# Patient Record
Sex: Male | Born: 1950 | Race: White | Hispanic: No | Marital: Married | Smoking: Never smoker
Health system: Southern US, Community
[De-identification: ages and names within clinical notes are randomized; demographics above are authoritative.]

## PROBLEM LIST (undated history)

## (undated) DIAGNOSIS — E119 Type 2 diabetes mellitus without complications: Secondary | ICD-10-CM

## (undated) DIAGNOSIS — J45909 Unspecified asthma, uncomplicated: Secondary | ICD-10-CM

---

## 2014-04-23 ENCOUNTER — Inpatient Hospital Stay (HOSPITAL_COMMUNITY): Payer: 59

## 2014-04-23 ENCOUNTER — Emergency Department (HOSPITAL_COMMUNITY): Payer: 59

## 2014-04-23 ENCOUNTER — Inpatient Hospital Stay (HOSPITAL_COMMUNITY): Payer: 59 | Admitting: Anesthesiology

## 2014-04-23 ENCOUNTER — Encounter (HOSPITAL_COMMUNITY): Payer: Self-pay | Admitting: *Deleted

## 2014-04-23 ENCOUNTER — Encounter (HOSPITAL_COMMUNITY): Admission: EM | Disposition: E | Payer: Self-pay | Source: Home / Self Care | Attending: Neurology

## 2014-04-23 ENCOUNTER — Inpatient Hospital Stay (HOSPITAL_COMMUNITY)
Admission: EM | Admit: 2014-04-23 | Discharge: 2014-05-18 | DRG: 023 | Disposition: E | Payer: 59 | Attending: Neurology | Admitting: Neurology

## 2014-04-23 DIAGNOSIS — E870 Hyperosmolality and hypernatremia: Secondary | ICD-10-CM | POA: Diagnosis present

## 2014-04-23 DIAGNOSIS — G8191 Hemiplegia, unspecified affecting right dominant side: Secondary | ICD-10-CM | POA: Diagnosis present

## 2014-04-23 DIAGNOSIS — Z7982 Long term (current) use of aspirin: Secondary | ICD-10-CM | POA: Diagnosis not present

## 2014-04-23 DIAGNOSIS — G936 Cerebral edema: Secondary | ICD-10-CM | POA: Diagnosis present

## 2014-04-23 DIAGNOSIS — R40243 Glasgow coma scale score 3-8: Secondary | ICD-10-CM | POA: Diagnosis present

## 2014-04-23 DIAGNOSIS — J9601 Acute respiratory failure with hypoxia: Secondary | ICD-10-CM

## 2014-04-23 DIAGNOSIS — E785 Hyperlipidemia, unspecified: Secondary | ICD-10-CM | POA: Diagnosis present

## 2014-04-23 DIAGNOSIS — J45909 Unspecified asthma, uncomplicated: Secondary | ICD-10-CM | POA: Diagnosis present

## 2014-04-23 DIAGNOSIS — Z01818 Encounter for other preprocedural examination: Secondary | ICD-10-CM

## 2014-04-23 DIAGNOSIS — R001 Bradycardia, unspecified: Secondary | ICD-10-CM | POA: Diagnosis present

## 2014-04-23 DIAGNOSIS — G935 Compression of brain: Secondary | ICD-10-CM | POA: Diagnosis present

## 2014-04-23 DIAGNOSIS — G911 Obstructive hydrocephalus: Secondary | ICD-10-CM | POA: Insufficient documentation

## 2014-04-23 DIAGNOSIS — E119 Type 2 diabetes mellitus without complications: Secondary | ICD-10-CM | POA: Diagnosis present

## 2014-04-23 DIAGNOSIS — R4020 Unspecified coma: Secondary | ICD-10-CM | POA: Diagnosis present

## 2014-04-23 DIAGNOSIS — I10 Essential (primary) hypertension: Secondary | ICD-10-CM

## 2014-04-23 DIAGNOSIS — G919 Hydrocephalus, unspecified: Secondary | ICD-10-CM | POA: Diagnosis present

## 2014-04-23 DIAGNOSIS — Z66 Do not resuscitate: Secondary | ICD-10-CM | POA: Diagnosis present

## 2014-04-23 DIAGNOSIS — R1310 Dysphagia, unspecified: Secondary | ICD-10-CM | POA: Diagnosis present

## 2014-04-23 DIAGNOSIS — I615 Nontraumatic intracerebral hemorrhage, intraventricular: Secondary | ICD-10-CM | POA: Diagnosis present

## 2014-04-23 DIAGNOSIS — I639 Cerebral infarction, unspecified: Secondary | ICD-10-CM

## 2014-04-23 DIAGNOSIS — R29810 Facial weakness: Secondary | ICD-10-CM | POA: Diagnosis present

## 2014-04-23 DIAGNOSIS — R4781 Slurred speech: Secondary | ICD-10-CM | POA: Diagnosis present

## 2014-04-23 DIAGNOSIS — J9690 Respiratory failure, unspecified, unspecified whether with hypoxia or hypercapnia: Secondary | ICD-10-CM

## 2014-04-23 DIAGNOSIS — I619 Nontraumatic intracerebral hemorrhage, unspecified: Secondary | ICD-10-CM | POA: Diagnosis present

## 2014-04-23 DIAGNOSIS — Z515 Encounter for palliative care: Secondary | ICD-10-CM | POA: Diagnosis not present

## 2014-04-23 DIAGNOSIS — J9602 Acute respiratory failure with hypercapnia: Secondary | ICD-10-CM

## 2014-04-23 HISTORY — DX: Type 2 diabetes mellitus without complications: E11.9

## 2014-04-23 HISTORY — DX: Unspecified asthma, uncomplicated: J45.909

## 2014-04-23 HISTORY — PX: CRANIECTOMY: SHX331

## 2014-04-23 LAB — URINALYSIS, ROUTINE W REFLEX MICROSCOPIC
BILIRUBIN URINE: NEGATIVE
GLUCOSE, UA: 250 mg/dL — AB
HGB URINE DIPSTICK: NEGATIVE
KETONES UR: NEGATIVE mg/dL
Leukocytes, UA: NEGATIVE
Nitrite: NEGATIVE
PH: 6 (ref 5.0–8.0)
PROTEIN: NEGATIVE mg/dL
Specific Gravity, Urine: 1.015 (ref 1.005–1.030)
Urobilinogen, UA: 1 mg/dL (ref 0.0–1.0)

## 2014-04-23 LAB — I-STAT CHEM 8, ED
BUN: 13 mg/dL (ref 6–23)
CHLORIDE: 101 mmol/L (ref 96–112)
Calcium, Ion: 1.06 mmol/L — ABNORMAL LOW (ref 1.13–1.30)
Creatinine, Ser: 0.7 mg/dL (ref 0.50–1.35)
Glucose, Bld: 205 mg/dL — ABNORMAL HIGH (ref 70–99)
HEMATOCRIT: 45 % (ref 39.0–52.0)
Hemoglobin: 15.3 g/dL (ref 13.0–17.0)
Potassium: 4.2 mmol/L (ref 3.5–5.1)
Sodium: 136 mmol/L (ref 135–145)
TCO2: 23 mmol/L (ref 0–100)

## 2014-04-23 LAB — DIFFERENTIAL
Basophils Absolute: 0 10*3/uL (ref 0.0–0.1)
Basophils Relative: 0 % (ref 0–1)
EOS PCT: 1 % (ref 0–5)
Eosinophils Absolute: 0.1 10*3/uL (ref 0.0–0.7)
LYMPHS ABS: 1.1 10*3/uL (ref 0.7–4.0)
LYMPHS PCT: 19 % (ref 12–46)
MONOS PCT: 6 % (ref 3–12)
Monocytes Absolute: 0.4 10*3/uL (ref 0.1–1.0)
Neutro Abs: 4.2 10*3/uL (ref 1.7–7.7)
Neutrophils Relative %: 74 % (ref 43–77)

## 2014-04-23 LAB — COMPREHENSIVE METABOLIC PANEL
ALT: 18 U/L (ref 0–53)
ANION GAP: 7 (ref 5–15)
AST: 26 U/L (ref 0–37)
Albumin: 3.5 g/dL (ref 3.5–5.2)
Alkaline Phosphatase: 56 U/L (ref 39–117)
BUN: 11 mg/dL (ref 6–23)
CALCIUM: 8.6 mg/dL (ref 8.4–10.5)
CO2: 25 mmol/L (ref 19–32)
Chloride: 103 mmol/L (ref 96–112)
Creatinine, Ser: 0.83 mg/dL (ref 0.50–1.35)
GFR calc non Af Amer: >90 mL/min (ref >90)
GLUCOSE: 204 mg/dL — AB (ref 70–99)
Potassium: 4 mmol/L (ref 3.5–5.1)
Sodium: 135 mmol/L (ref 135–145)
Total Bilirubin: 0.8 mg/dL (ref 0.3–1.2)
Total Protein: 6.2 g/dL (ref 6.0–8.3)

## 2014-04-23 LAB — BLOOD GAS, ARTERIAL
Acid-Base Excess: 1.3 mmol/L (ref 0.0–2.0)
BICARBONATE: 26 meq/L — AB (ref 20.0–24.0)
Drawn by: 249101
FIO2: 0.4 %
MECHVT: 680 mL
O2 Saturation: 99.1 %
PATIENT TEMPERATURE: 98.6
PCO2 ART: 46 mmHg — AB (ref 35.0–45.0)
PEEP/CPAP: 5 cmH2O
PH ART: 7.371 (ref 7.350–7.450)
PO2 ART: 142 mmHg — AB (ref 80.0–100.0)
RATE: 14 resp/min
TCO2: 27.4 mmol/L (ref 0–100)

## 2014-04-23 LAB — CBC
HCT: 40.6 % (ref 39.0–52.0)
HEMOGLOBIN: 14.2 g/dL (ref 13.0–17.0)
MCH: 31 pg (ref 26.0–34.0)
MCHC: 35 g/dL (ref 30.0–36.0)
MCV: 88.6 fL (ref 78.0–100.0)
PLATELETS: 252 10*3/uL (ref 150–400)
RBC: 4.58 MIL/uL (ref 4.22–5.81)
RDW: 12.9 % (ref 11.5–15.5)
WBC: 5.8 10*3/uL (ref 4.0–10.5)

## 2014-04-23 LAB — PROTIME-INR
INR: 1.01 (ref 0.00–1.49)
PROTHROMBIN TIME: 13.4 s (ref 11.6–15.2)

## 2014-04-23 LAB — GLUCOSE, CAPILLARY
GLUCOSE-CAPILLARY: 243 mg/dL — AB (ref 70–99)
Glucose-Capillary: 219 mg/dL — ABNORMAL HIGH (ref 70–99)
Glucose-Capillary: 240 mg/dL — ABNORMAL HIGH (ref 70–99)
Glucose-Capillary: 261 mg/dL — ABNORMAL HIGH (ref 70–99)

## 2014-04-23 LAB — RAPID URINE DRUG SCREEN, HOSP PERFORMED
AMPHETAMINES: NOT DETECTED
BARBITURATES: NOT DETECTED
BENZODIAZEPINES: NOT DETECTED
Cocaine: NOT DETECTED
Opiates: NOT DETECTED
Tetrahydrocannabinol: NOT DETECTED

## 2014-04-23 LAB — MRSA PCR SCREENING: MRSA by PCR: NEGATIVE

## 2014-04-23 LAB — SODIUM
SODIUM: 138 mmol/L (ref 135–145)
SODIUM: 139 mmol/L (ref 135–145)

## 2014-04-23 LAB — I-STAT TROPONIN, ED: TROPONIN I, POC: 0 ng/mL (ref 0.00–0.08)

## 2014-04-23 LAB — ETHANOL: Alcohol, Ethyl (B): <5 mg/dL (ref 0–9)

## 2014-04-23 LAB — APTT: aPTT: 27 seconds (ref 24–37)

## 2014-04-23 SURGERY — CRANIECTOMY POSTERIOR FOSSA DECOMPRESSION
Anesthesia: General | Site: Head | Laterality: Left

## 2014-04-23 MED ORDER — FENTANYL CITRATE 0.05 MG/ML IJ SOLN
INTRAMUSCULAR | Status: DC | PRN
  Administered 2014-04-23: 50 ug via INTRAVENOUS
  Administered 2014-04-23: 100 ug via INTRAVENOUS
  Administered 2014-04-23: 50 ug via INTRAVENOUS

## 2014-04-23 MED ORDER — PANTOPRAZOLE SODIUM 40 MG IV SOLR
40.0000 mg | Freq: Every day | INTRAVENOUS | Status: DC
  Administered 2014-04-23 – 2014-04-24 (×2): 40 mg via INTRAVENOUS
  Filled 2014-04-23 (×3): qty 40

## 2014-04-23 MED ORDER — 0.9 % SODIUM CHLORIDE (POUR BTL) OPTIME
TOPICAL | Status: DC | PRN
  Administered 2014-04-23 (×5): 1000 mL

## 2014-04-23 MED ORDER — BISACODYL 5 MG PO TBEC
5.0000 mg | DELAYED_RELEASE_TABLET | Freq: Every day | ORAL | Status: DC | PRN
  Filled 2014-04-23: qty 1

## 2014-04-23 MED ORDER — PRO-STAT SUGAR FREE PO LIQD
60.0000 mL | Freq: Every day | ORAL | Status: DC
  Administered 2014-04-23 – 2014-04-25 (×6): 60 mL
  Filled 2014-04-23 (×14): qty 60

## 2014-04-23 MED ORDER — HYDRALAZINE HCL 20 MG/ML IJ SOLN
10.0000 mg | Freq: Four times a day (QID) | INTRAMUSCULAR | Status: DC | PRN
  Administered 2014-04-23 (×2): 10 mg via INTRAVENOUS
  Administered 2014-04-24 – 2014-04-25 (×3): 20 mg via INTRAVENOUS
  Filled 2014-04-23 (×5): qty 1

## 2014-04-23 MED ORDER — BACITRACIN 50000 UNITS IM SOLR
INTRAMUSCULAR | Status: DC | PRN
  Administered 2014-04-23: 500 mL

## 2014-04-23 MED ORDER — PHENYLEPHRINE HCL 10 MG/ML IJ SOLN
INTRAMUSCULAR | Status: AC
  Filled 2014-04-23: qty 1

## 2014-04-23 MED ORDER — PANTOPRAZOLE SODIUM 40 MG IV SOLR: 40.0000 mg | Freq: Every day | INTRAVENOUS | Status: DC

## 2014-04-23 MED ORDER — WHITE PETROLATUM GEL
Status: AC
  Administered 2014-04-23: 17:00:00
  Filled 2014-04-23: qty 1

## 2014-04-23 MED ORDER — CEFAZOLIN SODIUM-DEXTROSE 2-3 GM-% IV SOLR
2.0000 g | Freq: Three times a day (TID) | INTRAVENOUS | Status: AC
  Administered 2014-04-23 – 2014-04-24 (×2): 2 g via INTRAVENOUS
  Filled 2014-04-23 (×2): qty 50

## 2014-04-23 MED ORDER — VITAL HIGH PROTEIN PO LIQD
1000.0000 mL | ORAL | Status: DC
  Administered 2014-04-23: 1000 mL
  Filled 2014-04-23 (×2): qty 1000

## 2014-04-23 MED ORDER — SODIUM CHLORIDE 0.9 % IV SOLN
INTRAVENOUS | Status: DC | PRN
  Administered 2014-04-23 (×2): via INTRAVENOUS

## 2014-04-23 MED ORDER — BISACODYL 10 MG RE SUPP: 10.0000 mg | Freq: Every day | RECTAL | Status: DC | PRN

## 2014-04-23 MED ORDER — INSULIN ASPART 100 UNIT/ML ~~LOC~~ SOLN: 0.0000 [IU] | SUBCUTANEOUS | Status: DC

## 2014-04-23 MED ORDER — PROPOFOL 10 MG/ML IV EMUL
INTRAVENOUS | Status: AC
  Filled 2014-04-23: qty 100

## 2014-04-23 MED ORDER — PROPOFOL 10 MG/ML IV BOLUS
INTRAVENOUS | Status: DC | PRN
  Administered 2014-04-23 (×2): 80 mg via INTRAVENOUS

## 2014-04-23 MED ORDER — ONDANSETRON HCL 4 MG/2ML IJ SOLN
INTRAMUSCULAR | Status: AC
  Filled 2014-04-23: qty 2

## 2014-04-23 MED ORDER — CEFAZOLIN SODIUM-DEXTROSE 2-3 GM-% IV SOLR
INTRAVENOUS | Status: AC
  Filled 2014-04-23: qty 100

## 2014-04-23 MED ORDER — LEVETIRACETAM IN NACL 500 MG/100ML IV SOLN
500.0000 mg | Freq: Two times a day (BID) | INTRAVENOUS | Status: DC
  Administered 2014-04-23 – 2014-04-24 (×3): 500 mg via INTRAVENOUS
  Filled 2014-04-23 (×5): qty 100

## 2014-04-23 MED ORDER — MICROFIBRILLAR COLL HEMOSTAT EX POWD
CUTANEOUS | Status: DC | PRN
  Administered 2014-04-23: 1 g via TOPICAL

## 2014-04-23 MED ORDER — NICARDIPINE HCL IN NACL 20-0.86 MG/200ML-% IV SOLN
3.0000 mg/h | INTRAVENOUS | Status: DC
Start: 2014-04-23 — End: 2014-04-25
  Administered 2014-04-23: 5 mg/h via INTRAVENOUS

## 2014-04-23 MED ORDER — ACETAMINOPHEN 650 MG RE SUPP
650.0000 mg | RECTAL | Status: DC | PRN
  Administered 2014-04-25: 650 mg via RECTAL
  Filled 2014-04-23: qty 1

## 2014-04-23 MED ORDER — ROCURONIUM BROMIDE 100 MG/10ML IV SOLN
INTRAVENOUS | Status: DC | PRN
  Administered 2014-04-23: 50 mg via INTRAVENOUS
  Administered 2014-04-23: 30 mg via INTRAVENOUS

## 2014-04-23 MED ORDER — DOCUSATE SODIUM 50 MG/5ML PO LIQD
100.0000 mg | Freq: Two times a day (BID) | ORAL | Status: DC | PRN
  Filled 2014-04-23: qty 10

## 2014-04-23 MED ORDER — METOCLOPRAMIDE HCL 5 MG/ML IJ SOLN
10.0000 mg | Freq: Three times a day (TID) | INTRAMUSCULAR | Status: DC
  Administered 2014-04-24 – 2014-04-25 (×5): 10 mg via INTRAVENOUS
  Filled 2014-04-23 (×8): qty 2

## 2014-04-23 MED ORDER — SODIUM CHLORIDE 3 % IV SOLN
INTRAVENOUS | Status: DC
  Administered 2014-04-23: 50 mL/h via INTRAVENOUS
  Administered 2014-04-23: 75 mL/h via INTRAVENOUS
  Administered 2014-04-24: 50 mL/h via INTRAVENOUS
  Administered 2014-04-24 (×2): 75 mL/h via INTRAVENOUS
  Filled 2014-04-23 (×10): qty 500

## 2014-04-23 MED ORDER — LIDOCAINE HCL (CARDIAC) 20 MG/ML IV SOLN
INTRAVENOUS | Status: AC
  Filled 2014-04-23: qty 5

## 2014-04-23 MED ORDER — NICARDIPINE HCL IN NACL 20-0.86 MG/200ML-% IV SOLN
INTRAVENOUS | Status: AC
  Filled 2014-04-23: qty 200

## 2014-04-23 MED ORDER — INSULIN ASPART 100 UNIT/ML ~~LOC~~ SOLN
0.0000 [IU] | SUBCUTANEOUS | Status: DC
  Administered 2014-04-23: 11 [IU] via SUBCUTANEOUS
  Administered 2014-04-23 – 2014-04-24 (×5): 7 [IU] via SUBCUTANEOUS
  Administered 2014-04-24 – 2014-04-25 (×4): 11 [IU] via SUBCUTANEOUS
  Administered 2014-04-25: 7 [IU] via SUBCUTANEOUS

## 2014-04-23 MED ORDER — FENTANYL CITRATE 0.05 MG/ML IJ SOLN
INTRAMUSCULAR | Status: AC
  Filled 2014-04-23: qty 5

## 2014-04-23 MED ORDER — SODIUM CHLORIDE 0.9 % IV SOLN
INTRAVENOUS | Status: DC
  Administered 2014-04-23: 09:00:00 via INTRAVENOUS

## 2014-04-23 MED ORDER — ACETAMINOPHEN 325 MG PO TABS: 650.0000 mg | ORAL_TABLET | ORAL | Status: DC | PRN

## 2014-04-23 MED ORDER — PHENYLEPHRINE HCL 10 MG/ML IJ SOLN
10.0000 mg | INTRAMUSCULAR | Status: DC | PRN
  Administered 2014-04-23: 10 ug/min via INTRAVENOUS

## 2014-04-23 MED ORDER — IPRATROPIUM-ALBUTEROL 0.5-2.5 (3) MG/3ML IN SOLN
3.0000 mL | Freq: Four times a day (QID) | RESPIRATORY_TRACT | Status: DC
  Administered 2014-04-23 – 2014-04-25 (×6): 3 mL via RESPIRATORY_TRACT
  Filled 2014-04-23 (×6): qty 3

## 2014-04-23 MED ORDER — CHLORHEXIDINE GLUCONATE 0.12 % MT SOLN
15.0000 mL | Freq: Two times a day (BID) | OROMUCOSAL | Status: DC
  Administered 2014-04-23 – 2014-04-25 (×4): 15 mL via OROMUCOSAL
  Filled 2014-04-23 (×4): qty 15

## 2014-04-23 MED ORDER — BUPIVACAINE-EPINEPHRINE 0.5% -1:200000 IJ SOLN
INTRAMUSCULAR | Status: DC | PRN
  Administered 2014-04-23: 10 mL

## 2014-04-23 MED ORDER — CEFAZOLIN SODIUM-DEXTROSE 2-3 GM-% IV SOLR
INTRAVENOUS | Status: DC | PRN
  Administered 2014-04-23: 2 g via INTRAVENOUS

## 2014-04-23 MED ORDER — POTASSIUM CHLORIDE IN NACL 20-0.9 MEQ/L-% IV SOLN
INTRAVENOUS | Status: DC
  Filled 2014-04-23 (×2): qty 1000

## 2014-04-23 MED ORDER — PROMETHAZINE HCL 25 MG PO TABS: 12.5000 mg | ORAL_TABLET | ORAL | Status: DC | PRN

## 2014-04-23 MED ORDER — ETOMIDATE 2 MG/ML IV SOLN
INTRAVENOUS | Status: AC
  Filled 2014-04-23: qty 20

## 2014-04-23 MED ORDER — SUCCINYLCHOLINE CHLORIDE 20 MG/ML IJ SOLN
INTRAMUSCULAR | Status: AC | PRN
  Administered 2014-04-23: 100 mg via INTRAVENOUS

## 2014-04-23 MED ORDER — BACITRACIN ZINC 500 UNIT/GM EX OINT
TOPICAL_OINTMENT | CUTANEOUS | Status: DC | PRN
  Administered 2014-04-23: 1 via TOPICAL

## 2014-04-23 MED ORDER — VITAL AF 1.2 CAL PO LIQD
1000.0000 mL | ORAL | Status: DC
  Administered 2014-04-23 – 2014-04-24 (×4): 1000 mL
  Filled 2014-04-23 (×4): qty 1000

## 2014-04-23 MED ORDER — LABETALOL HCL 5 MG/ML IV SOLN
10.0000 mg | INTRAVENOUS | Status: DC | PRN
  Administered 2014-04-24 – 2014-04-25 (×3): 20 mg via INTRAVENOUS
  Filled 2014-04-23 (×3): qty 4

## 2014-04-23 MED ORDER — FENTANYL CITRATE 0.05 MG/ML IJ SOLN: 50.0000 ug | INTRAMUSCULAR | Status: DC | PRN

## 2014-04-23 MED ORDER — ONDANSETRON HCL 4 MG/2ML IJ SOLN: 4.0000 mg | INTRAMUSCULAR | Status: DC | PRN

## 2014-04-23 MED ORDER — ACETAMINOPHEN 650 MG RE SUPP: 650.0000 mg | RECTAL | Status: DC | PRN

## 2014-04-23 MED ORDER — SENNOSIDES-DOCUSATE SODIUM 8.6-50 MG PO TABS
1.0000 | ORAL_TABLET | Freq: Two times a day (BID) | ORAL | Status: DC
Start: 2014-04-23 — End: 2014-04-25
  Administered 2014-04-23 – 2014-04-24 (×3): 1 via ORAL
  Filled 2014-04-23 (×7): qty 1

## 2014-04-23 MED ORDER — PNEUMOCOCCAL VAC POLYVALENT 25 MCG/0.5ML IJ INJ
0.5000 mL | INJECTION | INTRAMUSCULAR | Status: AC
  Administered 2014-04-24: 0.5 mL via INTRAMUSCULAR
  Filled 2014-04-23: qty 0.5

## 2014-04-23 MED ORDER — LABETALOL HCL 5 MG/ML IV SOLN
10.0000 mg | INTRAVENOUS | Status: DC | PRN
  Administered 2014-04-23: 20 mg via INTRAVENOUS
  Filled 2014-04-23: qty 4

## 2014-04-23 MED ORDER — PROPOFOL 10 MG/ML IV BOLUS
INTRAVENOUS | Status: AC
  Filled 2014-04-23: qty 20

## 2014-04-23 MED ORDER — THROMBIN 20000 UNITS EX SOLR
CUTANEOUS | Status: DC | PRN
  Administered 2014-04-23: 20 mL via TOPICAL

## 2014-04-23 MED ORDER — MORPHINE SULFATE 2 MG/ML IJ SOLN: 1.0000 mg | INTRAMUSCULAR | Status: DC | PRN

## 2014-04-23 MED ORDER — ACETAMINOPHEN 325 MG PO TABS
650.0000 mg | ORAL_TABLET | ORAL | Status: DC | PRN
  Administered 2014-04-24 – 2014-04-25 (×3): 650 mg via ORAL
  Filled 2014-04-23 (×3): qty 2

## 2014-04-23 MED ORDER — ETOMIDATE 2 MG/ML IV SOLN
INTRAVENOUS | Status: AC | PRN
  Administered 2014-04-23: 20 mg via INTRAVENOUS

## 2014-04-23 MED ORDER — PROPOFOL 10 MG/ML IV EMUL
5.0000 ug/kg/min | Freq: Once | INTRAVENOUS | Status: AC
Start: 2014-04-23 — End: 2014-04-23
  Administered 2014-04-23: 50 ug/kg/min via INTRAVENOUS

## 2014-04-23 MED ORDER — ROCURONIUM BROMIDE 50 MG/5ML IV SOLN
INTRAVENOUS | Status: AC
  Filled 2014-04-23: qty 1

## 2014-04-23 MED ORDER — ROCURONIUM BROMIDE 50 MG/5ML IV SOLN
INTRAVENOUS | Status: AC
  Filled 2014-04-23: qty 2

## 2014-04-23 MED ORDER — VITAL AF 1.2 CAL PO LIQD
1000.0000 mL | ORAL | Status: DC
  Filled 2014-04-23: qty 1000

## 2014-04-23 MED ORDER — MICROFIBRILLAR COLL HEMOSTAT EX PADS
MEDICATED_PAD | CUTANEOUS | Status: DC | PRN
Start: 2014-04-23 — End: 2014-04-23
  Administered 2014-04-23: 1 via TOPICAL

## 2014-04-23 MED ORDER — SUCCINYLCHOLINE CHLORIDE 20 MG/ML IJ SOLN
INTRAMUSCULAR | Status: AC
  Filled 2014-04-23: qty 1

## 2014-04-23 MED ORDER — CETYLPYRIDINIUM CHLORIDE 0.05 % MT LIQD
7.0000 mL | Freq: Four times a day (QID) | OROMUCOSAL | Status: DC
  Administered 2014-04-23 – 2014-04-25 (×6): 7 mL via OROMUCOSAL

## 2014-04-23 MED ORDER — STROKE: EARLY STAGES OF RECOVERY BOOK
Freq: Once | Status: DC
  Filled 2014-04-23: qty 1

## 2014-04-23 MED ORDER — ONDANSETRON HCL 4 MG PO TABS: 4.0000 mg | ORAL_TABLET | ORAL | Status: DC | PRN

## 2014-04-23 SURGICAL SUPPLY — 81 items
BAG DECANTER FOR FLEXI CONT (MISCELLANEOUS) ×3 IMPLANT
BENZOIN TINCTURE PRP APPL 2/3 (GAUZE/BANDAGES/DRESSINGS) IMPLANT
BLADE CLIPPER SURG NEURO (BLADE) ×3 IMPLANT
BLADE ULTRA TIP 2M (BLADE) IMPLANT
BRUSH SCRUB EZ 1% IODOPHOR (MISCELLANEOUS) IMPLANT
BRUSH SCRUB EZ PLAIN DRY (MISCELLANEOUS) ×3 IMPLANT
BUR ACORN 6.0 PRECISION (BURR) ×2 IMPLANT
BUR ACORN 6.0MM PRECISION (BURR) ×1
BUR TAPERED ROUTER 2.3 (BUR) ×1 IMPLANT
BURR TAPERED ROUTER 2.3 (BUR) ×3
CANISTER SUCT 3000ML PPV (MISCELLANEOUS) ×3 IMPLANT
CLIP TI MEDIUM 6 (CLIP) IMPLANT
CONT SPEC 4OZ CLIKSEAL STRL BL (MISCELLANEOUS) ×3 IMPLANT
CORDS BIPOLAR (ELECTRODE) IMPLANT
COVER MAYO STAND STRL (DRAPES) IMPLANT
DRAIN SNY WOU 7FLT (WOUND CARE) IMPLANT
DRAPE LAPAROTOMY 100X72 PEDS (DRAPES) IMPLANT
DRAPE MICROSCOPE LEICA (MISCELLANEOUS) IMPLANT
DRAPE NEUROLOGICAL W/INCISE (DRAPES) ×3 IMPLANT
DRAPE SURG 17X23 STRL (DRAPES) IMPLANT
DRAPE WARM FLUID 44X44 (DRAPE) ×3 IMPLANT
DRSG TELFA 3X8 NADH (GAUZE/BANDAGES/DRESSINGS) IMPLANT
DURAMATRIX ONLAY 2X2 (Neuro Prosthesis/Implant) ×3 IMPLANT
ELECT CAUTERY BLADE 6.4 (BLADE) IMPLANT
ELECT REM PT RETURN 9FT ADLT (ELECTROSURGICAL) ×3
ELECTRODE REM PT RTRN 9FT ADLT (ELECTROSURGICAL) ×1 IMPLANT
EVACUATOR 1/8 PVC DRAIN (DRAIN) IMPLANT
EVACUATOR SILICONE 100CC (DRAIN) IMPLANT
FORCEPS BIPOLAR SPETZLER 8 1.0 (NEUROSURGERY SUPPLIES) ×3 IMPLANT
GAUZE SPONGE 4X4 12PLY STRL (GAUZE/BANDAGES/DRESSINGS) ×3 IMPLANT
GAUZE SPONGE 4X4 16PLY XRAY LF (GAUZE/BANDAGES/DRESSINGS) IMPLANT
GLOVE BIO SURGEON STRL SZ7 (GLOVE) ×3 IMPLANT
GLOVE BIO SURGEON STRL SZ8 (GLOVE) ×3 IMPLANT
GLOVE BIO SURGEON STRL SZ8.5 (GLOVE) ×3 IMPLANT
GLOVE BIOGEL PI IND STRL 7.5 (GLOVE) ×2 IMPLANT
GLOVE BIOGEL PI INDICATOR 7.5 (GLOVE) ×4
GLOVE EXAM NITRILE LRG STRL (GLOVE) IMPLANT
GLOVE EXAM NITRILE MD LF STRL (GLOVE) IMPLANT
GLOVE EXAM NITRILE XL STR (GLOVE) IMPLANT
GLOVE EXAM NITRILE XS STR PU (GLOVE) IMPLANT
GLOVE SS BIOGEL STRL SZ 8 (GLOVE) IMPLANT
GLOVE SUPERSENSE BIOGEL SZ 8 (GLOVE)
GLOVE SURG SS PI 7.0 STRL IVOR (GLOVE) ×6 IMPLANT
GOWN STRL REUS W/ TWL LRG LVL3 (GOWN DISPOSABLE) IMPLANT
GOWN STRL REUS W/ TWL XL LVL3 (GOWN DISPOSABLE) ×3 IMPLANT
GOWN STRL REUS W/TWL LRG LVL3 (GOWN DISPOSABLE)
GOWN STRL REUS W/TWL XL LVL3 (GOWN DISPOSABLE) ×6
KIT BASIN OR (CUSTOM PROCEDURE TRAY) ×3 IMPLANT
KIT ROOM TURNOVER OR (KITS) ×3 IMPLANT
NEEDLE HYPO 22GX1.5 SAFETY (NEEDLE) ×3 IMPLANT
NS IRRIG 1000ML POUR BTL (IV SOLUTION) ×15 IMPLANT
PACK CRANIOTOMY (CUSTOM PROCEDURE TRAY) ×3 IMPLANT
PAD ARMBOARD 7.5X6 YLW CONV (MISCELLANEOUS) ×12 IMPLANT
PATTIES SURGICAL 1/4 X 3 (GAUZE/BANDAGES/DRESSINGS) IMPLANT
PATTIES SURGICAL 1X1 (DISPOSABLE) ×3 IMPLANT
PLATE 1.5  2HOLE MED NEURO (Plate) ×6 IMPLANT
PLATE 1.5 2HOLE MED NEURO (Plate) ×3 IMPLANT
RUBBERBAND STERILE (MISCELLANEOUS) IMPLANT
SCREW SELF DRILL HT 1.5/4MM (Screw) ×18 IMPLANT
SPONGE LAP 4X18 X RAY DECT (DISPOSABLE) IMPLANT
SPONGE NEURO XRAY DETECT 1X3 (DISPOSABLE) ×9 IMPLANT
STAPLER SKIN PROX WIDE 3.9 (STAPLE) ×3 IMPLANT
SUT ETHILON 2 0 FS 18 (SUTURE) IMPLANT
SUT ETHILON 3 0 FSL (SUTURE) IMPLANT
SUT NURALON 4 0 TR CR/8 (SUTURE) ×3 IMPLANT
SUT PROLENE 6 0 BV (SUTURE) IMPLANT
SUT VIC AB 1 CT1 18XBRD ANBCTR (SUTURE) ×1 IMPLANT
SUT VIC AB 1 CT1 8-18 (SUTURE) ×2
SUT VIC AB 2-0 CP2 18 (SUTURE) ×6 IMPLANT
SUT VIC AB 3-0 SH 8-18 (SUTURE) ×3 IMPLANT
SYR 20ML ECCENTRIC (SYRINGE) ×3 IMPLANT
SYR BULB 3OZ (MISCELLANEOUS) ×6 IMPLANT
SYR CONTROL 10ML LL (SYRINGE) IMPLANT
TAPE CLOTH SURG 4X10 WHT LF (GAUZE/BANDAGES/DRESSINGS) ×3 IMPLANT
TOWEL OR 17X24 6PK STRL BLUE (TOWEL DISPOSABLE) ×3 IMPLANT
TOWEL OR 17X26 10 PK STRL BLUE (TOWEL DISPOSABLE) ×3 IMPLANT
TRAY FOLEY CATH 14FRSI W/METER (CATHETERS) IMPLANT
TUBE CONNECTING 12'X1/4 (SUCTIONS) ×1
TUBE CONNECTING 12X1/4 (SUCTIONS) ×2 IMPLANT
UNDERPAD 30X30 INCONTINENT (UNDERPADS AND DIAPERS) IMPLANT
WATER STERILE IRR 1000ML POUR (IV SOLUTION) ×3 IMPLANT

## 2014-04-23 NOTE — Progress Notes (Signed)
Transported pt on ventilator from ED trauma C to 76M 10. Increased VT to 730 prior to transport to match 8 cc/kg order. Bedside report given to RT.

## 2014-04-23 NOTE — Consult Note (Signed)
PULMONARY / CRITICAL CARE MEDICINE   Name: Clinton [name] MRN: [id number] DOB: [birth year]    ADMISSION DATE:  05/07/2014  CONSULTATION DATE:  04/21/2014  REFERRING MD :  Cyril Mourning (Stroke)  CHIEF COMPLAINT:  ICH, vent management   INITIAL PRESENTATION:  64yo male with hx DM and ?asthma admitted 3/7 with acute large left ICH with midline shift.  Intubated in ER and PCCM consulted for vent management.   STUDIES:  CT head 3/7>>> large L temporal and occipital ICH 8.3 cm with 11mm of L->R shift   SIGNIFICANT EVENTS:    HISTORY OF PRESENT ILLNESS:  64 yo male with hx DM, asthma, other PMH unknown who was found by a passerby in his car on the side of the road with confusion, R sided weakness and slurred speech.  Last seen normal at 715am leaving for work, found in car at 730am.  Initial BP 180/60.  He became increasingly hypoxic with worsening neuro status and was intubated in ER.  PCCM consulted for vent management and medical assist.   PAST MEDICAL HISTORY :   has a past medical history of Diabetes mellitus without complication and Asthma.  has no past surgical history on file. Prior to Admission medications   Not on File   Allergies not on file  FAMILY HISTORY:  has no family status information on file.  SOCIAL HISTORY:    REVIEW OF SYSTEMS:  Unable.  As per HPI obtained from records.   SUBJECTIVE:   VITAL SIGNS: Temp:  [97 F (36.1 C)-99.1 F (37.3 C)] 97 F (36.1 C) (03/07 0911) Pulse Rate:  [59-74] 67 (03/07 0900) Resp:  [15-21] 17 (03/07 0900) BP: (123-200)/(59-84) 123/59 mmHg (03/07 0900) SpO2:  [93 %-99 %] 98 % (03/07 0900) Weight:  [263 lb (119.296 kg)] 263 lb (119.296 kg) (03/07 0835) HEMODYNAMICS:   VENTILATOR SETTINGS:   INTAKE / OUTPUT: No intake or output data in the 24 hours ending 04/17/2014 0933  PHYSICAL EXAMINATION: General:  wdwn male, agitated  Neuro:  Sedated on propofol, rass +1, moving L arm, tries to pull at ETT HEENT:  Mm moist, no JVD, ETT   Cardiovascular:  s1s2 rrr Lungs:  resps even, non labored, mildly tachypneic on vent, very diminished throughout, few exp wheeze  Abdomen:  Soft, +bs  Musculoskeletal:  Warm and dry, no edema   LABS:  CBC  Recent Labs Lab 04/22/2014 0822 04/21/2014 0840  WBC 5.8  --   HGB 14.2 15.3  HCT 40.6 45.0  PLT 252  --    Coag's  Recent Labs Lab 05/17/2014 0822  APTT 27  INR 1.01   BMET  Recent Labs Lab 05/11/2014 0822 05/11/2014 0840  NA 135 136  K 4.0 4.2  CL 103 101  CO2 25  --   BUN 11 13  CREATININE 0.83 0.70  GLUCOSE 204* 205*   Electrolytes  Recent Labs Lab 04/24/2014 0822  CALCIUM 8.6   Sepsis Markers No results for input(s): LATICACIDVEN, PROCALCITON, O2SATVEN in the last 168 hours. ABG No results for input(s): PHART, PCO2ART, PO2ART in the last 168 hours. Liver Enzymes  Recent Labs Lab 04/18/2014 0822  AST 26  ALT 18  ALKPHOS 56  BILITOT 0.8  ALBUMIN 3.5   Cardiac Enzymes No results for input(s): TROPONINI, PROBNP in the last 168 hours. Glucose No results for input(s): GLUCAP in the last 168 hours.  Imaging No results found.   ASSESSMENT / PLAN:   NEUROLOGIC Large L ICH with midline shift  P:  RASS goal: -1 Propofol gtt for sedation  F/u CT scan  3% saline per neuro    PULMONARY OETT 3/7>>> Acute respiratory failure - in setting large ICH ?hx asthma  P:   Vent support - 8cc/kg  F/u CXR  F/u ABG now  BD's - will schedule for now    CARDIOVASCULAR CVL L IJ CVL 3/7>>> HTN  P:  Continue cardene gtt for BP control - goal SBP 140s  PRN labetalol  Place CVL for 3% saline   RENAL No active issue  P:   Monitor chem with 3% NaCl   GASTROINTESTINAL No active issue  P:   Early TF  PPI   HEMATOLOGIC ICH P:  Trend CBC SCD's   INFECTIOUS No active issue  P:   Monitor fever, wbc curve off abx   ENDOCRINE DM  P:   SSI    FAMILY  - Updates:  No family available 3/7  - Inter-disciplinary family meet or Palliative  Care meeting due by:  3/14  Dirk DressKaty Whiteheart, NP 05/10/2014  9:33 AM Pager: [phone] or [phone]  My neurologic exam now is very poor, patient is posturing, babenski is up going, no pupillary or gag reflexes noted.  Wife has not spoken with anybody and had a lot of questions.  I am concerned that patient herniating.  Contacted neurology to see if neurosurgery needs to evaluate patient.  In the meantime, continue full vent support with goal CO2 of 35.  Continue three percent saline.  Asked neurology to come back and re-examine patient given deterioration.  The patient is critically ill with multiple organ systems failure and requires high complexity decision making for assessment and support, frequent evaluation and titration of therapies, application of advanced monitoring technologies and extensive interpretation of multiple databases.   Critical Care Time devoted to patient care services described in this note is  35  Minutes. This time reflects time of care of this signee Dr Koren BoundWesam Yacoub. This critical care time does not reflect procedure time, or teaching time or supervisory time of PA/NP/Med student/Med Resident etc but could involve care discussion time.  Alyson ReedyWesam G. Yacoub, M.D. [facility]monary/Critical Care Medicine. Pager: [phone]. After hours pager: [phone].

## 2014-04-23 NOTE — Progress Notes (Signed)
eLink Physician-Brief Progress Note Patient Name: William HamburgerMichael Derderian DOB: 12-03-1950 MRN: [id number]   Date of Service  05/07/2014  HPI/Events of Note  Hypertensive s/p crainiotomy. Goal SBP 140-160. Note also on hypertonic saline, would like to avoid the fluid load that would come with nicardipine gtt. Has been bradycardic so labetalol not a good choice. Will try hydralazine first and adjust depending on results  eICU Interventions       Intervention Category Intermediate Interventions: Hypertension - evaluation and management  Kaytee Taliercio S. 05/13/2014, 5:17 PM

## 2014-04-23 NOTE — Consult Note (Addendum)
Referring Physician: ED    Chief Complaint: code stroke, confusion, dysarthria, right hemiparesis, right face weakness  HPI:                                                                                                                                         Clinton [name] is an 64 y.o. male with a past medical history significant for DM and asthma, brought in as a code stroke via EMS due to acute onset of the above stated symptoms.  As per EMS, patient was heading to work when a passerby saw him waving from inside his vehicle, in the driver seat. He was driving the wrong way. EMS was called and found him confused, with right sided weakness, slurred speech, a left gaze preference and BP 182/80.  Upon arrival to the ED patient with NIHSS 24. CT brain was personally reviewed and revealed a large left temporal-occipital ICH measuring 8.3 cm with surrounding vasogenic edema and11 mm of left to right midline shift. No intraventricular extension or hydrocephalus. INR 1.1, PTT 27, platelets 252. Takes a baby aspirin daily but no report of recent fall, head trauma, or use or anticoagulants. Patient was not protecting his airway and required to be intubated. Started on nicardipine infusion. Date last known well: 04/18/2014  Time last known well: 7:15 am tPA Given: no, ICH NIHSS: 24   Past Medical History  Diagnosis Date  . Diabetes mellitus without complication   . Asthma     History reviewed. No pertinent past surgical history.  History reviewed. No pertinent family history. Social History:  has no tobacco, alcohol, and drug history on file.  Allergies: Allergies not on file  Medications:                                                                                                                           Scheduled: .  stroke: mapping our early stages of recovery book   Does not apply Once  . etomidate      . feeding supplement (VITAL HIGH PROTEIN)  1,000 mL Per Tube Q24H  . insulin  aspart  0-15 Units Subcutaneous 6 times per day  . lidocaine (cardiac) 100 mg/33m      . niCARdipine in saline      . pantoprazole (PROTONIX) IV  40 mg Intravenous QHS  . propofol      .  rocuronium      . senna-docusate  1 tablet Oral BID  . succinylcholine        ROS: unable to obtain due to mental status                                                                                                                                      History obtained from chart review  Physical exam: no apparent distress. Blood pressure 123/59, pulse 67, temperature 97 F (36.1 C), temperature source Rectal, resp. rate 17, height _0  (1.981 m), weight 119.296 kg (263 lb), SpO2 98 %. Head: normocephalic. Neck: supple, no bruits, no JVD. Cardiac: no murmurs. Lungs: clear. Abdomen: soft, no tender, no mass. Extremities: no edema. Skin: no rash  Neurologic Examination:                                                                                                      General: Mental Status: Awake,  Dysarthric, follows simple commands inconsistently,  Cranial Nerves: II: Discs were not assessed; Visual fields appear to be impaired in the right, pupils equal, round, reactive to light III,IV, VI: ptosis not present, left gaze preference V,VII: smile asymmetric due to right face weakness, facial light touch sensation unreliable VIII: hearing normal bilaterally IX,X: unable to assess XI: bilateral shoulder shrug no tested XII: midline tongue Motor: Dense right hemiplegia. Tone diminished in the right  Sensory: Pinprick and light touch decreased in the right side Deep Tendon Reflexes:  Right: Upper Extremity   Left: Upper extremity   biceps (C-5 to C-6) 2/4   biceps (C-5 to C-6) 2/4 tricep (C7) 2/4    triceps (C7) 2/4 Brachioradialis (C6) 2/4  Brachioradialis (C6) 2/4  Lower Extremity Lower Extremity  quadriceps (L-2 to L-4) 2/4   quadriceps (L-2 to L-4) 2/4 Achilles (S1) 2/4   Achilles  (S1) 2/4  Plantars: Right: upgoing   Left: downgoing Cerebellar: Unable to test at this time. Gait:  Unable to test at this moment.    Results for orders placed or performed during the hospital encounter of 04/27/2014 (from the past 48 hour(s))  Ethanol     Status: None   Collection Time: 04/28/2014  8:22 AM  Result Value Ref Range   Alcohol, Ethyl (B) <5 0 - 9 mg/dL    Comment:        LOWEST DETECTABLE LIMIT FOR SERUM ALCOHOL IS 11 mg/dL FOR MEDICAL PURPOSES ONLY   Protime-INR     Status: None  Collection Time: 05/03/2014  8:22 AM  Result Value Ref Range   Prothrombin Time 13.4 11.6 - 15.2 seconds   INR 1.01 0.00 - 1.49  APTT     Status: None   Collection Time: 04/29/2014  8:22 AM  Result Value Ref Range   aPTT 27 24 - 37 seconds  CBC     Status: None   Collection Time: 04/24/2014  8:22 AM  Result Value Ref Range   WBC 5.8 4.0 - 10.5 K/uL   RBC 4.58 4.22 - 5.81 MIL/uL   Hemoglobin 14.2 13.0 - 17.0 g/dL   HCT 40.6 39.0 - 52.0 %   MCV 88.6 78.0 - 100.0 fL   MCH 31.0 26.0 - 34.0 pg   MCHC 35.0 30.0 - 36.0 g/dL   RDW 12.9 11.5 - 15.5 %   Platelets 252 150 - 400 K/uL  Differential     Status: None   Collection Time: 05/16/2014  8:22 AM  Result Value Ref Range   Neutrophils Relative % 74 43 - 77 %   Neutro Abs 4.2 1.7 - 7.7 K/uL   Lymphocytes Relative 19 12 - 46 %   Lymphs Abs 1.1 0.7 - 4.0 K/uL   Monocytes Relative 6 3 - 12 %   Monocytes Absolute 0.4 0.1 - 1.0 K/uL   Eosinophils Relative 1 0 - 5 %   Eosinophils Absolute 0.1 0.0 - 0.7 K/uL   Basophils Relative 0 0 - 1 %   Basophils Absolute 0.0 0.0 - 0.1 K/uL  Comprehensive metabolic panel     Status: Abnormal   Collection Time: 05/07/2014  8:22 AM  Result Value Ref Range   Sodium 135 135 - 145 mmol/L   Potassium 4.0 3.5 - 5.1 mmol/L   Chloride 103 96 - 112 mmol/L   CO2 25 19 - 32 mmol/L   Glucose, Bld 204 (H) 70 - 99 mg/dL   BUN 11 6 - 23 mg/dL   Creatinine, Ser 0.83 0.50 - 1.35 mg/dL   Calcium 8.6 8.4 - 10.5 mg/dL    Total Protein 6.2 6.0 - 8.3 g/dL   Albumin 3.5 3.5 - 5.2 g/dL   AST 26 0 - 37 U/L   ALT 18 0 - 53 U/L   Alkaline Phosphatase 56 39 - 117 U/L   Total Bilirubin 0.8 0.3 - 1.2 mg/dL   GFR calc non Af Amer >90 >90 mL/min   GFR calc Af Amer >90 >90 mL/min    Comment: (NOTE) The eGFR has been calculated using the CKD EPI equation. This calculation has not been validated in all clinical situations. eGFR's persistently <90 mL/min signify possible Chronic Kidney Disease.    Anion gap 7 5 - 15  I-Stat Troponin, ED (not at [facility])     Status: None   Collection Time: 04/22/2014  8:33 AM  Result Value Ref Range   Troponin i, poc 0.00 0.00 - 0.08 ng/mL   Comment 3            Comment: Due to the release kinetics of cTnI, a negative result within the first hours of the onset of symptoms does not rule out myocardial infarction with certainty. If myocardial infarction is still suspected, repeat the test at appropriate intervals.   I-Stat Chem 8, ED     Status: Abnormal   Collection Time: 04/24/2014  8:40 AM  Result Value Ref Range   Sodium 136 135 - 145 mmol/L   Potassium 4.2 3.5 - 5.1 mmol/L   Chloride  101 96 - 112 mmol/L   BUN 13 6 - 23 mg/dL   Creatinine, Ser 0.70 0.50 - 1.35 mg/dL   Glucose, Bld 205 (H) 70 - 99 mg/dL   Calcium, Ion 1.06 (L) 1.13 - 1.30 mmol/L   TCO2 23 0 - 100 mmol/L   Hemoglobin 15.3 13.0 - 17.0 g/dL   HCT 45.0 39.0 - 52.0 %   Ct Head Wo Contrast  04/24/2014   CLINICAL DATA:  Right-sided weakness, slurred speech.  EXAM: CT HEAD WITHOUT CONTRAST  TECHNIQUE: Contiguous axial images were obtained from the base of the skull through the vertex without intravenous contrast.  COMPARISON:  None.  FINDINGS: There is a large left posterior temporal and occipital hemorrhage measuring 8.3 x 2.6 cm. Surrounding edema. Mass effect on the adjacent lateral ventricle. There is midline shift with transtentorial herniation of approximately 11 mm from left-to-right. No hydrocephalus. No  intraventricular blood visualized at this time.  No acute calvarial abnormality. Visualized paranasal sinuses and mastoids clear. Orbital soft tissues unremarkable.  IMPRESSION: Large left temporal and occipital intra cerebral hemorrhage measuring up to 8.3 cm with 11 mm of left-to-right midline shift.  Critical Value/emergent results were called by telephone at the time of interpretation on 05/17/2014 at 8:39 am to Dr. Aram Beecham, who verbally acknowledged these results.   Electronically Signed   By: Rolm Baptise M.D.   On: 05/07/2014 08:39   Dg Chest Port 1 View  05/09/2014   CLINICAL DATA:  Status post intubation, NG tube insertion  EXAM: PORTABLE CHEST - 1 VIEW  COMPARISON:  None.  FINDINGS: Endotracheal to 7.4 cm from carinal. NG tube extends below the margin of the film and in the course of the esophagus. No pneumothorax. No pulmonary edema.  IMPRESSION: Support apparatus in good position.   Electronically Signed   By: Suzy Bouchard M.D.   On: 04/28/2014 09:15    Assessment: 64 y.o. male with large lobar left temporal and occipital ICH. No IV extension or hydrocephalus but significant left to right MLF. Takes a baby aspirin daily but no evidence of coagulopathy, recent head trauma, or use of anticoagulants. Patient hypertensive in the ED requiring IV nicardipine, thus although a lobar hemorrhage a primary hypertensive ICH still likely. Admit to NICU. Start 3% hypertonic saline. Continue nicardipine infusion, target SBP 140. CT brain in am (further neuro-imaging as per stroke attending). Stroke team will follow up tomorrow.  Stroke Risk Factors - DM  Dorian Pod, MD [facility] [phone]  05/06/2014, 9:39 AM   Addendum: called by ICU attending regarding patient decline in neurological status. He is posturing. I spoke with neurosurgery who will operate on patient now.  Dorian Pod, MD

## 2014-04-23 NOTE — Procedures (Signed)
Central Venous Catheter Insertion Procedure Note William HamburgerMichael Dunivan [id number] 1950-04-13  Procedure: Insertion of Central Venous Catheter Indications: Assessment of intravascular volume, Drug and/or fluid administration and 3% saline   Procedure Details Consent: Unable to obtain consent because of emergent medical necessity. Time Out: Verified patient identification, verified procedure, site/side was marked, verified correct patient position, special equipment/implants available, medications/allergies/relevent history reviewed, required imaging and test results available.  Performed  Maximum sterile technique was used including antiseptics, cap, gloves, gown, hand hygiene, mask and sheet. Skin prep: Chlorhexidine; local anesthetic administered A antimicrobial bonded/coated triple lumen catheter was placed in the left internal jugular vein using the Seldinger technique.  Evaluation Blood flow good Complications: No apparent complications Patient did tolerate procedure well. Chest X-ray ordered to verify placement.  CXR: pending.  Performed under direct MD supervision.  Performed using ultrasound guidance.  Wire visualized in vessel under ultrasound.   Dirk DressKaty Whiteheart, NP 05/09/2014  10:25 AM  U/S used in placement.  I was present and supervised the entire procedure.  Alyson ReedyWesam G. Glenmore Karl, M.D. [facility]/Critical Care Medicine. Pager: [phone]. After hours pager: [phone].

## 2014-04-23 NOTE — Procedures (Signed)
Intubation Procedure Note William HamburgerMichael Palau [id number] 22-Aug-1950  Procedure: Intubation Indications: Airway protection and maintenance  Procedure Details Consent: Unable to obtain consent because of altered level of consciousness. Time Out: Verified patient identification, verified procedure, site/side was marked, verified correct patient position, special equipment/implants available, medications/allergies/relevent history reviewed, required imaging and test results available.  Performed  Maximum sterile technique was used including gloves, gown, hand hygiene and mask.  MAC and 4    Evaluation Hemodynamic Status: BP stable throughout; O2 sats: stable throughout Patient's Current Condition: stable Complications: No apparent complications Patient did tolerate procedure well. Chest X-ray ordered to verify placement.  CXR: pending.  Pt intubated for airway protection following Code Stroke, using Mac 4 with 8.0 ett on 1st attempt. Bilateral breath sounds, diminished throughout. Positive color change, CXR pending at this time. Copious oral secretions (clear/ pink tinged). Pt stable throughout with no complications.    Carolan ShiverKelley, Cyara Devoto M 05/05/2014

## 2014-04-23 NOTE — ED Notes (Signed)
Pt last seen normal at 0715.  Pt driving to work; on looker noticed pt pull to side of road and "acting erratic."  EMS called and responded at 0730.  EMS stated right sided paralysis, confusion, slurred speech.  180/60 intial pressure.  Showed up to ER at 0815 160/80, urinary incontinence.

## 2014-04-23 NOTE — Progress Notes (Signed)
INITIAL NUTRITION ASSESSMENT  DOCUMENTATION CODES Per approved criteria  -Obesity Unspecified   INTERVENTION:  D/C Vital High Protein  Initiate Vital AF 1.2 @ 40 ml/hr via OG tube   60 ml Prostat five times per day.    Tube feeding regimen provides 2152 kcal, 222 grams of protein, and 778 ml of H2O.   NUTRITION DIAGNOSIS: Inadequate oral intake related to inability to eat as evidenced by NPO status  Goal: Enteral nutrition to provide 60-70% of estimated calorie needs (22-25 kcals/kg ideal body weight) and 100% of estimated protein needs, based on ASPEN guidelines for hypocaloric, high protein feeding in critically ill obese individuals   Monitor:  Vent status, TF adequacy and tolerance, plan of care  Reason for Assessment: Consult received to initiate and manage enteral nutrition support.  64 y.o. male   ASSESSMENT: Pt admitted with large left ICH now s/p craniotomy for evacuation. Pt not following commands, posturing.  Per wife at bedside pt was eating normally for him, has a big appetite.  Patient is currently intubated on ventilator support MV: 12.3 L/min Temp (24hrs), Avg:96 F (35.6 C), Min:93.4 F (34.1 C), Max:99.1 F (37.3 C) Pt discussed during ICU rounds and with RN.  No signs of fat or muscle depletion noted.   Height: Ht Readings from Last 1 Encounters:  Dec 31, 2014 6\' 6"  (1.981 m)    Weight: Wt Readings from Last 1 Encounters:  Dec 31, 2014 263 lb (119.296 kg)    Ideal Body Weight: 97.2  % Ideal Body Weight: 82%  Wt Readings from Last 10 Encounters:  Dec 31, 2014 263 lb (119.296 kg)    Usual Body Weight: unknown  % Usual Body Weight: -  BMI:  Body mass index is 30.4 kg/(m^2).  Estimated Nutritional Needs: Kcal: 2140-2430 Protein: >/= 194 grams Fluid: > 2.1 L/day  Skin: WDL  Diet Order: Diet NPO time specified  EDUCATION NEEDS: -No education needs identified at this time  No intake or output data in the 24 hours ending Dec 31, 2014  1140  Last BM: PTA   Labs:   Recent Labs Lab Dec 31, 2014 0822 Dec 31, 2014 0840  NA 135 136  K 4.0 4.2  CL 103 101  CO2 25  --   BUN 11 13  CREATININE 0.83 0.70  CALCIUM 8.6  --   GLUCOSE 204* 205*    CBG (last 3)  No results for input(s): GLUCAP in the last 72 hours.  Scheduled Meds: .  stroke: mapping our early stages of recovery book   Does not apply Once  . etomidate      . feeding supplement (VITAL HIGH PROTEIN)  1,000 mL Per Tube Q24H  . insulin aspart  0-15 Units Subcutaneous 6 times per day  . ipratropium-albuterol  3 mL Nebulization Q6H  . lidocaine (cardiac) 100 mg/615ml      . niCARdipine in saline      . pantoprazole (PROTONIX) IV  40 mg Intravenous QHS  . propofol      . rocuronium      . senna-docusate  1 tablet Oral BID  . succinylcholine        Continuous Infusions: . niCARDipine 2.5 mg/hr (Dec 31, 2014 0907)  . sodium chloride (hypertonic) 50 mL/hr (Dec 31, 2014 1130)    Past Medical History  Diagnosis Date  . Diabetes mellitus without complication   . Asthma     History reviewed. No pertinent past surgical history.  Kendell BaneHeather Freya Zobrist RD, LDN, CNSC [phone] Pager [phone] After Hours Pager

## 2014-04-23 NOTE — Progress Notes (Signed)
eLink Physician-Brief Progress Note Patient Name: Clinton [name] DOB: 04-18-1950 MRN: [id number]   Date of Service  05/09/2014  HPI/Events of Note  High TF residuals despite decrease in TF rate.  eICU Interventions  Plan: Reglan 10 mg IV q8 hours for 72 hours     Intervention Category Minor Interventions: Routine modifications to care plan (e.g. PRN medications for pain, fever)  DETERDING,ELIZABETH 05/17/2014, 11:39 PM

## 2014-04-23 NOTE — Anesthesia Postprocedure Evaluation (Signed)
  Anesthesia Post-op Note  Patient: Clinton HamburgerMichael [name]  Procedure(s) Performed: Procedure(s) with comments: Parietal/Occipital Craniectomy for Hemorrhage (Left) - left  Patient Location: PACU and NICU  Anesthesia Type:General  Level of Consciousness: Patient remains intubated per anesthesia plan  Airway and Oxygen Therapy: Patient placed on Ventilator (see vital sign flow sheet for setting)  Post-op Pain: mild  Post-op Assessment: Post-op Vital signs reviewed  Post-op Vital Signs: Reviewed  Last Vitals:  Filed Vitals:   04/22/2014 1541  BP:   Pulse:   Temp: 36.2 C  Resp:     Complications: No apparent anesthesia complications

## 2014-04-23 NOTE — Progress Notes (Signed)
RT Note: Pt arrived from OR, put on previous vent settings. Pt tolerating well at this time. RT will continue to monitor.

## 2014-04-23 NOTE — Code Documentation (Signed)
64yo [facility] at [phone].  EMS reports that the patient was found by a passerby in his car on the side of the road with confusion and right sided weakness.  When asked questions patient would only repeat his date of birth.  EMS reports that he was driving himself to work, however, his car was going in the opposite direction. Patient with a h/o DM per EMS, medication list was not located.  Patient to CT on arrival.  Stroke team to the bedside.  CT showing large left ICH with midline shift.  Patient back to Trauma C.  NIHSS 24, see documentation for details and code stroke times.  Patient with right hemiplegia, left gaze preference, and aphasia.  Patient intubated by Dr. Silverio LayYao for airway protection.  Patient hypertensive with BP 190/74.  Cardene IV ordered to keep SBP 140-160.  Patient to be admitted to ICU.  Bedside handoff with ED RNs SwaledaleHayley and Maralyn SagoSarah.

## 2014-04-23 NOTE — Progress Notes (Signed)
Per pt's wife, pt previously worked as a Psychologist, occupationalwelder and is known to have metal fragments in his body which may impact his ability to tolerate MRI scan safely.

## 2014-04-23 NOTE — Op Note (Signed)
Brief history: The patient is a Clinton [name] year old white male who became acutely confused this morning. He was brought to the emergency department and worked up with a head CT which demonstrated a large left intracerebral hemorrhage. The patient's neurologic status progressively declined. He was intubated. A neurosurgical consultation was requested. I immediately assessed the patient and posted him for surgery. I discussed the situation with the patient's wife. I recommended surgery. I discussed the risks, benefits, and alternatives to a left craniotomy for evacuation of intracerebral hemorrhage. I answered all the patient's wife's questions. She has consented on behalf of the patient.  Preoperative diagnosis: Left intracerebral hemorrhage  A sub-diagnosis: Same  Procedure: Left craniotomy for evacuation of intracerebral hemorrhage  Surgeon: Dr. Delma OfficerJeff Naveyah Iacovelli  Assistant: None  Anesthesia: Gen. endotracheal  Estimated blood loss: 150 mL  Specimens: None  Drains: None  Complications: None  Description of procedure: The patient was brought to the operating room by the anesthesia team. General endotracheal anesthesia was induced. I applied the Mayfield 3 point head rest of the patient's calvarium. He was then turned to the prone position on chest rolls. The patient's parietal-occipital region was then shaved with clippers and prepared with Betadine scrub and Betadine solution. Sterile drapes were applied. I then injected the area to be incised with Marcaine with epinephrine solution. Ice the scalpel to make a paramedian incision in the left parietal-occipital region. I use Raney clips for a wound edge hemostasis. I exposed the underlying calvarium with electrocautery and the periosteal elevators. I inserted the cerebellar retractor for exposure. I then used a high-speed drill to create a burr hole. I used the footplate device to create a left parietal occipital craniotomy flap. I elevated the flap with a  Penfield #1. I then incised the underlying dura with a scalpel and Metzenbaum scissors. The brain began to partially herniating out through the anatomy flap. I used bipolar cautery and suction to perform a corticotomy. I immediately encountered a large hematoma. I removed the hematoma using suction, irrigation, and electrocautery. We encountered quite a bit of nonviable brain which were removed with suction. After I was satisfied with the evacuation of the hematoma I obtained hemostasis using bipolar cautery, Avitene slurry, Gelfoam, etc. I irrigated the resection cavity out with saline solution until the irrigant came back crystal clear. I then lined the resection cavity with Surgicel. At this point the brain was pulsatile and we had good hemostasis. I therefore harshly reapproximated the patient's dura with 4-0 Nurolon suture. I then laid dural matrix over the exposed dura. I reapproximated patient's craniotomy flap with titanium mini plates and screws. I then removed the cerebellar retractor and reapproximated patient's galea with interrupted 2-0 Vicryl suture. I reapproximated the skin with stainless steel staples. I coated the wound with bacitracin ointment. A sterile dressing was applied. The drapes were removed. The patient was subtotally returned to the supine position. I then removed the Mayfield 3 point headrest from the calvarium. There was some bleeding from the pin site which I held direct pressure on and closed with a few staples. By report all sponge, instrument, and needle counts were correct at the end of this case. The patient was transferred to the ICU in critical condition and intubated.

## 2014-04-23 NOTE — ED Provider Notes (Signed)
CSN: [id number]     Arrival date & time 05/16/2014  [id number] History   First MD Initiated Contact with Patient 05/13/2014 [phone]     Chief Complaint  Patient presents with  . Code Stroke    @EDPCLEARED @ (Consider location/radiation/quality/duration/timing/severity/associated sxs/prior Treatment) The history is provided by the EMS personnel. The history is limited by the condition of the patient.  Clinton [name] is a 64 y.o. male hx of DM, asthma here with possible stroke. Last normal was 7:15 AM. He left home to go to work. He was found inside his car altered and not behaving right. EMS was called and he was noted to have slurred speech as well as right-sided paralysis. Code stroke activated and initial blood pressure was elevated.    Level V caveat- condition of patient   Past Medical History  Diagnosis Date  . Diabetes mellitus without complication   . Asthma    History reviewed. No pertinent past surgical history. History reviewed. No pertinent family history. History  Substance Use Topics  . Smoking status: Not on file  . Smokeless tobacco: Not on file  . Alcohol Use: Not on file    Review of Systems  Unable to perform ROS: Mental status change      Allergies  Review of patient's allergies indicates not on file.  Home Medications   Prior to Admission medications   Not on File   BP 186/84 mmHg  Pulse 57  Temp(Src) 99.1 F (37.3 C) (Oral)  Resp 18  Ht 6\' 8"  (2.032 m)  Wt 263 lb (119.296 kg)  BMI 28.89 kg/m2  SpO2 99% Physical Exam  Constitutional:  Ill appearing, somnolent   HENT:  Head: Normocephalic.  Eyes:  Eye deviation to the L   Neck: Normal range of motion.  Cardiovascular: Normal rate, regular rhythm and normal heart sounds.   Pulmonary/Chest: Effort normal and breath sounds normal. No respiratory distress. He has no wheezes. He has no rales.  Abdominal: Soft. Bowel sounds are normal. He exhibits no distension. There is no tenderness. There is no rebound.   Musculoskeletal: Normal range of motion.  Neurological:  Somnolent. Obvious L facial droop. R side flaccid paralysis.   Skin: Skin is warm.  Psychiatric:  Unable   Nursing note and vitals reviewed.   ED Course  Procedures (including critical care time)  CRITICAL CARE Performed by: Silverio LayYAO, Evarose Altland   Total critical care time: 30 min   Critical care time was exclusive of separately billable procedures and treating other patients.  Critical care was necessary to treat or prevent imminent or life-threatening deterioration.  Critical care was time spent personally by me on the following activities: development of treatment plan with patient and/or surrogate as well as nursing, discussions with consultants, evaluation of patient's response to treatment, examination of patient, obtaining history from patient or surrogate, ordering and performing treatments and interventions, ordering and review of laboratory studies, ordering and review of radiographic studies, pulse oximetry and re-evaluation of patient's condition.  INTUBATION Performed by: Silverio LayYAO, Mikeisha Lemonds  Required items: required blood products, implants, devices, and special equipment available Patient identity confirmed: provided demographic data and hospital-assigned identification number Time out: Immediately prior to procedure a "time out" was called to verify the correct patient, procedure, equipment, support staff and site/side marked as required.  Indications: hypoxia,   Intubation method: directLaryngoscopy   Preoxygenation: BVM  Sedatives: 20 mg Etomidate Paralytic: 100 mg Succinylcholine  Tube Size: 8.0 cuffed  Post-procedure assessment: chest rise and ETCO2 monitor Breath  sounds: equal and absent over the epigastrium Tube secured with: ETT holder Chest x-ray interpreted by radiologist and me.  Chest x-ray findings: endotracheal tube in appropriate position  Patient tolerated the procedure well with no immediate  complications.     Labs Review Labs Reviewed  I-STAT CHEM 8, ED - Abnormal; Notable for the following:    Glucose, Bld 205 (*)    Calcium, Ion 1.06 (*)    All other components within normal limits  PROTIME-INR  APTT  CBC  DIFFERENTIAL  ETHANOL  COMPREHENSIVE METABOLIC PANEL  URINE RAPID DRUG SCREEN (HOSP PERFORMED)  URINALYSIS, ROUTINE W REFLEX MICROSCOPIC  SODIUM  SODIUM  SODIUM  I-STAT TROPOININ, ED  I-STAT TROPOININ, ED    Imaging Review Ct Head Wo Contrast  May 10, 2014   CLINICAL DATA:  Right-sided weakness, slurred speech.  EXAM: CT HEAD WITHOUT CONTRAST  TECHNIQUE: Contiguous axial images were obtained from the base of the skull through the vertex without intravenous contrast.  COMPARISON:  None.  FINDINGS: There is a large left posterior temporal and occipital hemorrhage measuring 8.3 x 2.6 cm. Surrounding edema. Mass effect on the adjacent lateral ventricle. There is midline shift with transtentorial herniation of approximately 11 mm from left-to-right. No hydrocephalus. No intraventricular blood visualized at this time.  No acute calvarial abnormality. Visualized paranasal sinuses and mastoids clear. Orbital soft tissues unremarkable.  IMPRESSION: Large left temporal and occipital intra cerebral hemorrhage measuring up to 8.3 cm with 11 mm of left-to-right midline shift.  Critical Value/emergent results were called by telephone at the time of interpretation on 2014-05-10 at 8:39 am to Dr. Cyril Mourning, who verbally acknowledged these results.   Electronically Signed   By: Charlett Nose M.D.   On: 05-10-14 08:39     EKG Interpretation   Date/Time:  Monday 2014-05-10 08:40:36 EST Ventricular Rate:  62 PR Interval:  234 QRS Duration: 110 QT Interval:  432 QTC Calculation: 439 R Axis:   -24 Text Interpretation:  Sinus arrhythmia Ventricular premature complex  Prolonged PR interval Probable left atrial enlargement Borderline left  axis deviation Abnormal R-wave progression,  early transition No previous  ECGs available Confirmed by Trey Gulbranson  MD, Keshonna Valvo ([id number]) on 05-10-2014 8:58:37 AM      MDM   Final diagnoses:  Encounter for intubation    Mumin Denomme is a 64 y.o. male here presenting with code stroke. Given hypertension and altered mental status and was concern for possible bleed. The are at bedside and CT showed large L temporal and occipital bleed. Patient hypoxic to 89-90% as per EMS and drooling so intubated for hypoxia and airway protection. Started on cardene drip. Will admit to neuro ICU.     Richardean Canal, MD 10-May-2014 0900

## 2014-04-23 NOTE — Anesthesia Preprocedure Evaluation (Signed)
Anesthesia Evaluation  Patient identified by MRN, date of birth, ID band Patient awake    Reviewed: Allergy & Precautions, NPO status , Patient's Chart, lab work & pertinent test results  Airway       Comment: In ICU intubated. History noted.] CE Dental   Pulmonary asthma ,          Cardiovascular hypertension,     Neuro/Psych History noted. CE CVA    GI/Hepatic   Endo/Other  diabetes  Renal/GU      Musculoskeletal   Abdominal   Peds  Hematology   Anesthesia Other Findings   Reproductive/Obstetrics                             Anesthesia Physical Anesthesia Plan  ASA: IV  Anesthesia Plan: General   Post-op Pain Management:    Induction: Intravenous  Airway Management Planned: Oral ETT  Additional Equipment: Arterial line  Intra-op Plan:   Post-operative Plan: Post-operative intubation/ventilation  Informed Consent:   Plan Discussed with: CRNA, Anesthesiologist and Surgeon  Anesthesia Plan Comments:         Anesthesia Quick Evaluation

## 2014-04-23 NOTE — ED Notes (Signed)
CCM at the bedside. °

## 2014-04-23 NOTE — Consult Note (Signed)
Reason for Consult: Left intracerebral hemorrhage Referring Physician: [name] is an 64 y.o. male.  HPI: The patient is a 64 year old white male who was in his usual state of good health until this morning when he became acutely confused, dysphasic, etc. He was brought to [facility] and admitted. The patient has gotten progressively worse. He is now intubated and posturing. I was asked to see the patient. I was in the hospital and Immediately evaluated the patient. I spoke with his wife and recommended surgery. He is not on any anticoagulants except occasional aspirin  Past Medical History  Diagnosis Date  . Diabetes mellitus without complication   . Asthma     History reviewed. No pertinent past surgical history.  History reviewed. No pertinent family history.  Social History:  has no tobacco, alcohol, and drug history on file.  Allergies:  Allergies  Allergen Reactions  . Dyazide [Hydrochlorothiazide W-Triamterene] Rash    Medications:  I have reviewed the patient's current medications. Prior to Admission:  Prescriptions prior to admission  Medication Sig Dispense Refill Last Dose  . aspirin 81 MG tablet Take 81 mg by mouth daily.   unk  . co-enzyme Q-10 30 MG capsule Take 30 mg by mouth daily.   unk  . glyBURIDE (DIABETA) 5 MG tablet Take 10 mg by mouth 2 (two) times daily.   unk  . Insulin Detemir (LEVEMIR) 100 UNIT/ML Pen Inject 32 Units into the skin every morning.   unk  . lisinopril (PRINIVIL,ZESTRIL) 20 MG tablet Take 20 mg by mouth daily.   unk  . metFORMIN (GLUCOPHAGE) 1000 MG tablet Take 1,000 mg by mouth 2 (two) times daily with a meal.   unk  . Omega-3 Fatty Acids (FISH OIL PO) Take 1 capsule by mouth daily.   unk  . pravastatin (PRAVACHOL) 40 MG tablet Take 40 mg by mouth daily.   unk   Scheduled: .  stroke: mapping our early stages of recovery book   Does not apply Once  . etomidate      . feeding supplement (VITAL HIGH PROTEIN)   1,000 mL Per Tube Q24H  . insulin aspart  0-15 Units Subcutaneous 6 times per day  . ipratropium-albuterol  3 mL Nebulization Q6H  . lidocaine (cardiac) 100 mg/33m      . niCARdipine in saline      . pantoprazole (PROTONIX) IV  40 mg Intravenous QHS  . propofol      . rocuronium      . senna-docusate  1 tablet Oral BID  . succinylcholine       Continuous: . niCARDipine 2.5 mg/hr (04/22/2014 0907)  . sodium chloride (hypertonic) 50 mL/hr (05/02/2014 1130)   PUVO:[id number]**OR** acetaminophen, bisacodyl **OR** bisacodyl, docusate **OR** docusate, fentaNYL, fentaNYL, labetalol Anti-infectives    None       Results for orders placed or performed during the hospital encounter of 04/20/2014 (from the past 48 hour(s))  Ethanol     Status: None   Collection Time: 04/24/2014  8:22 AM  Result Value Ref Range   Alcohol, Ethyl (B) <5 0 - 9 mg/dL    Comment:        LOWEST DETECTABLE LIMIT FOR SERUM ALCOHOL IS 11 mg/dL FOR MEDICAL PURPOSES ONLY   Protime-INR     Status: None   Collection Time: 04/24/2014  8:22 AM  Result Value Ref Range   Prothrombin Time 13.4 11.6 - 15.2 seconds   INR 1.01 0.00 - 1.49  APTT     Status: None   Collection Time: 04/30/2014  8:22 AM  Result Value Ref Range   aPTT 27 24 - 37 seconds  CBC     Status: None   Collection Time: 04/22/2014  8:22 AM  Result Value Ref Range   WBC 5.8 4.0 - 10.5 K/uL   RBC 4.58 4.22 - 5.81 MIL/uL   Hemoglobin 14.2 13.0 - 17.0 g/dL   HCT 40.6 39.0 - 52.0 %   MCV 88.6 78.0 - 100.0 fL   MCH 31.0 26.0 - 34.0 pg   MCHC 35.0 30.0 - 36.0 g/dL   RDW 12.9 11.5 - 15.5 %   Platelets 252 150 - 400 K/uL  Differential     Status: None   Collection Time: 05/16/2014  8:22 AM  Result Value Ref Range   Neutrophils Relative % 74 43 - 77 %   Neutro Abs 4.2 1.7 - 7.7 K/uL   Lymphocytes Relative 19 12 - 46 %   Lymphs Abs 1.1 0.7 - 4.0 K/uL   Monocytes Relative 6 3 - 12 %   Monocytes Absolute 0.4 0.1 - 1.0 K/uL   Eosinophils Relative 1 0 - 5 %    Eosinophils Absolute 0.1 0.0 - 0.7 K/uL   Basophils Relative 0 0 - 1 %   Basophils Absolute 0.0 0.0 - 0.1 K/uL  Comprehensive metabolic panel     Status: Abnormal   Collection Time: 05/14/2014  8:22 AM  Result Value Ref Range   Sodium 135 135 - 145 mmol/L   Potassium 4.0 3.5 - 5.1 mmol/L   Chloride 103 96 - 112 mmol/L   CO2 25 19 - 32 mmol/L   Glucose, Bld 204 (H) 70 - 99 mg/dL   BUN 11 6 - 23 mg/dL   Creatinine, Ser 0.83 0.50 - 1.35 mg/dL   Calcium 8.6 8.4 - 10.5 mg/dL   Total Protein 6.2 6.0 - 8.3 g/dL   Albumin 3.5 3.5 - 5.2 g/dL   AST 26 0 - 37 U/L   ALT 18 0 - 53 U/L   Alkaline Phosphatase 56 39 - 117 U/L   Total Bilirubin 0.8 0.3 - 1.2 mg/dL   GFR calc non Af Amer >90 >90 mL/min   GFR calc Af Amer >90 >90 mL/min    Comment: (NOTE) The eGFR has been calculated using the CKD EPI equation. This calculation has not been validated in all clinical situations. eGFR's persistently <90 mL/min signify possible Chronic Kidney Disease.    Anion gap 7 5 - 15  I-Stat Troponin, ED (not at [facility])     Status: None   Collection Time: 05/10/2014  8:33 AM  Result Value Ref Range   Troponin i, poc 0.00 0.00 - 0.08 ng/mL   Comment 3            Comment: Due to the release kinetics of cTnI, a negative result within the first hours of the onset of symptoms does not rule out myocardial infarction with certainty. If myocardial infarction is still suspected, repeat the test at appropriate intervals.   I-Stat Chem 8, ED     Status: Abnormal   Collection Time: 04/22/2014  8:40 AM  Result Value Ref Range   Sodium 136 135 - 145 mmol/L   Potassium 4.2 3.5 - 5.1 mmol/L   Chloride 101 96 - 112 mmol/L   BUN 13 6 - 23 mg/dL   Creatinine, Ser 0.70 0.50 - 1.35 mg/dL   Glucose, Bld 205 (H)  70 - 99 mg/dL   Calcium, Ion 1.06 (L) 1.13 - 1.30 mmol/L   TCO2 23 0 - 100 mmol/L   Hemoglobin 15.3 13.0 - 17.0 g/dL   HCT 45.0 39.0 - 52.0 %  Urine Drug Screen     Status: None   Collection Time: 05/14/2014  9:05 AM   Result Value Ref Range   Opiates NONE DETECTED NONE DETECTED   Cocaine NONE DETECTED NONE DETECTED   Benzodiazepines NONE DETECTED NONE DETECTED   Amphetamines NONE DETECTED NONE DETECTED   Tetrahydrocannabinol NONE DETECTED NONE DETECTED   Barbiturates NONE DETECTED NONE DETECTED    Comment:        DRUG SCREEN FOR MEDICAL PURPOSES ONLY.  IF CONFIRMATION IS NEEDED FOR ANY PURPOSE, NOTIFY LAB WITHIN 5 DAYS.        LOWEST DETECTABLE LIMITS FOR URINE DRUG SCREEN Drug Class       Cutoff (ng/mL) Amphetamine      1000 Barbiturate      200 Benzodiazepine   950 Tricyclics       932 Opiates          300 Cocaine          300 THC              50   Urinalysis, Routine w reflex microscopic     Status: Abnormal   Collection Time: 05/11/2014  9:05 AM  Result Value Ref Range   Color, Urine YELLOW YELLOW   APPearance CLEAR CLEAR   Specific Gravity, Urine 1.015 1.005 - 1.030   pH 6.0 5.0 - 8.0   Glucose, UA 250 (A) NEGATIVE mg/dL   Hgb urine dipstick NEGATIVE NEGATIVE   Bilirubin Urine NEGATIVE NEGATIVE   Ketones, ur NEGATIVE NEGATIVE mg/dL   Protein, ur NEGATIVE NEGATIVE mg/dL   Urobilinogen, UA 1.0 0.0 - 1.0 mg/dL   Nitrite NEGATIVE NEGATIVE   Leukocytes, UA NEGATIVE NEGATIVE    Comment: MICROSCOPIC NOT DONE ON URINES WITH NEGATIVE PROTEIN, BLOOD, LEUKOCYTES, NITRITE, OR GLUCOSE <1000 mg/dL.  Blood gas, arterial     Status: Abnormal   Collection Time: 04/21/2014 10:50 AM  Result Value Ref Range   FIO2 0.40 %   Delivery systems VENTILATOR    Mode PRESSURE REGULATED VOLUME CONTROL    VT 680 mL   Rate 14 resp/min   Peep/cpap 5.0 cm H20   pH, Arterial 7.371 7.350 - 7.450   pCO2 arterial 46.0 (H) 35.0 - 45.0 mmHg   pO2, Arterial 142.0 (H) 80.0 - 100.0 mmHg   Bicarbonate 26.0 (H) 20.0 - 24.0 mEq/L   TCO2 27.4 0 - 100 mmol/L   Acid-Base Excess 1.3 0.0 - 2.0 mmol/L   O2 Saturation 99.1 %   Patient temperature 98.6    Collection site RIGHT RADIAL    Drawn by [phone]    Sample type  ARTERIAL DRAW    Allens test (pass/fail) PASS PASS    Ct Head Wo Contrast  04/30/2014   CLINICAL DATA:  Right-sided weakness, slurred speech.  EXAM: CT HEAD WITHOUT CONTRAST  TECHNIQUE: Contiguous axial images were obtained from the base of the skull through the vertex without intravenous contrast.  COMPARISON:  None.  FINDINGS: There is a large left posterior temporal and occipital hemorrhage measuring 8.3 x 2.6 cm. Surrounding edema. Mass effect on the adjacent lateral ventricle. There is midline shift with transtentorial herniation of approximately 11 mm from left-to-right. No hydrocephalus. No intraventricular blood visualized at this time.  No acute calvarial abnormality. Visualized  paranasal sinuses and mastoids clear. Orbital soft tissues unremarkable.  IMPRESSION: Large left temporal and occipital intra cerebral hemorrhage measuring up to 8.3 cm with 11 mm of left-to-right midline shift.  Critical Value/emergent results were called by telephone at the time of interpretation on 05/12/2014 at 8:39 am to Dr. Aram Beecham, who verbally acknowledged these results.   Electronically Signed   By: Rolm Baptise M.D.   On: 05/17/2014 08:39   Dg Chest Port 1 View  05/06/2014   CLINICAL DATA:  Post central line placement.  EXAM: PORTABLE CHEST - 1 VIEW  COMPARISON:  05/04/2014  FINDINGS: Left central line tip is in the upper SVC at the confluence of the innominate veins. Endotracheal tube is unchanged. No pneumothorax. Heart is upper limits normal in size. Mild perihilar and bibasilar opacities are similar to prior study. No effusions.  IMPRESSION: Left central line tip in the upper SVC. No pneumothorax. Endotracheal tube in stable position.  Stable mild perihilar and bibasilar opacities.   Electronically Signed   By: Rolm Baptise M.D.   On: 04/22/2014 10:49   Dg Chest Port 1 View  04/17/2014   CLINICAL DATA:  Status post intubation, NG tube insertion  EXAM: PORTABLE CHEST - 1 VIEW  COMPARISON:  None.  FINDINGS:  Endotracheal to 7.4 cm from carinal. NG tube extends below the margin of the film and in the course of the esophagus. No pneumothorax. No pulmonary edema.  IMPRESSION: Support apparatus in good position.   Electronically Signed   By: Suzy Bouchard M.D.   On: 05/09/2014 09:15    ROS: Unobtainable Blood pressure 165/66, pulse 59, temperature 97.6 F (36.4 C), temperature source Axillary, resp. rate 17, height _0  (1.981 m), weight 119.296 kg (263 lb), SpO2 99 %. Physical Exam  General: A 64 year old white male who is intubated and comatose with spontaneous decerebrate posturing.  HEENT: Normocephalic, atraumatic, his pupils are approximately 5 mm and  nonreactive bilaterally  Neck: Supple  Thorax: Symmetric  Abdomen: Obese  Extremities: No obvious abnormalities  Neurologic exam: The patient is Glasgow Coma Scale 4 intubate, E1M2V1. The patient's pupils are as above 5 mm and nonreactive. He spontaneously decerebrate postures  I have reviewed the patient's head CT performed at [facility] today. There is a large left right occipital intracerebral hemorrhage with midline shift.  Assessment/Plan: Left intracerebral hemorrhage: I discussed the situation with the patient's wife. We have discussed the various treatment options including doing nothing, a comanagement, and surgery. I have recommended that he undergo an emergent left parietal occipital craniotomy for evacuation of intracerebral hemorrhage. I have described surgery to her. We have discussed the risks of surgery including risks of anesthesia, hemorrhage, infection, stroke, hemiplegia, aphasia, death, coma, etc. I have answered all her questions. She has consented on behalf of the patient. I have already posted the patient for emergency surgery. We are awaiting a room. He is on hypertonic saline presently.  Clinton [name] D 05/07/2014, 12:05 PM

## 2014-04-23 NOTE — Transfer of Care (Signed)
Immediate Anesthesia Transfer of Care Note  Patient: Clinton [name]  Procedure(s) Performed: Procedure(s) with comments: Parietal/Occipital Craniectomy for Hemorrhage (Left) - left  Patient Location: ICU  Anesthesia Type:General  Level of Consciousness: Patient remains intubated per anesthesia plan  Airway & Oxygen Therapy: Patient remains intubated per anesthesia plan and Patient placed on Ventilator (see vital sign flow sheet for setting)  Post-op Assessment: Report given to RN and Post -op Vital signs reviewed and stable  Post vital signs: Reviewed and stable  Last Vitals:  Filed Vitals:   23-Sep-2014 1500  BP: 93/49  Pulse: 63  Temp:   Resp: 18    Complications: No apparent anesthesia complications

## 2014-04-24 ENCOUNTER — Encounter (HOSPITAL_COMMUNITY): Payer: Self-pay

## 2014-04-24 ENCOUNTER — Inpatient Hospital Stay (HOSPITAL_COMMUNITY): Payer: 59

## 2014-04-24 DIAGNOSIS — I611 Nontraumatic intracerebral hemorrhage in hemisphere, cortical: Secondary | ICD-10-CM

## 2014-04-24 DIAGNOSIS — G911 Obstructive hydrocephalus: Secondary | ICD-10-CM

## 2014-04-24 LAB — BLOOD GAS, ARTERIAL
ACID-BASE DEFICIT: 1.7 mmol/L (ref 0.0–2.0)
Bicarbonate: 22.4 mEq/L (ref 20.0–24.0)
DRAWN BY: 232811
FIO2: 0.4 %
MECHVT: 680 mL
O2 Saturation: 99 %
PCO2 ART: 37 mmHg (ref 35.0–45.0)
PEEP: 5 cmH2O
PO2 ART: 137 mmHg — AB (ref 80.0–100.0)
Patient temperature: 98.6
RATE: 18 resp/min
TCO2: 23.5 mmol/L (ref 0–100)
pH, Arterial: 7.399 (ref 7.350–7.450)

## 2014-04-24 LAB — BASIC METABOLIC PANEL
Anion gap: 2 — ABNORMAL LOW (ref 5–15)
BUN: 14 mg/dL (ref 6–23)
CHLORIDE: 119 mmol/L — AB (ref 96–112)
CO2: 25 mmol/L (ref 19–32)
CREATININE: 0.85 mg/dL (ref 0.50–1.35)
Calcium: 8.7 mg/dL (ref 8.4–10.5)
GFR calc Af Amer: >90 mL/min (ref >90)
GFR calc non Af Amer: 89 mL/min — ABNORMAL LOW (ref >90)
Glucose, Bld: 260 mg/dL — ABNORMAL HIGH (ref 70–99)
Potassium: 4.1 mmol/L (ref 3.5–5.1)
SODIUM: 146 mmol/L — AB (ref 135–145)

## 2014-04-24 LAB — CBC
HCT: 41 % (ref 39.0–52.0)
Hemoglobin: 14.4 g/dL (ref 13.0–17.0)
MCH: 31 pg (ref 26.0–34.0)
MCHC: 35.1 g/dL (ref 30.0–36.0)
MCV: 88.4 fL (ref 78.0–100.0)
PLATELETS: 310 10*3/uL (ref 150–400)
RBC: 4.64 MIL/uL (ref 4.22–5.81)
RDW: 13.1 % (ref 11.5–15.5)
WBC: 11.5 10*3/uL — ABNORMAL HIGH (ref 4.0–10.5)

## 2014-04-24 LAB — GLUCOSE, CAPILLARY
Glucose-Capillary: 259 mg/dL — ABNORMAL HIGH (ref 70–99)
Glucose-Capillary: 233 mg/dL — ABNORMAL HIGH (ref 70–99)
Glucose-Capillary: 221 mg/dL — ABNORMAL HIGH (ref 70–99)
Glucose-Capillary: 225 mg/dL — ABNORMAL HIGH (ref 70–99)
Glucose-Capillary: 261 mg/dL — ABNORMAL HIGH (ref 70–99)

## 2014-04-24 LAB — HEMOGLOBIN A1C
Hgb A1c MFr Bld: 6.6 % — ABNORMAL HIGH (ref 4.8–5.6)
Mean Plasma Glucose: 143 mg/dL

## 2014-04-24 LAB — SODIUM
Sodium: 145 mmol/L (ref 135–145)
Sodium: 149 mmol/L — ABNORMAL HIGH (ref 135–145)
Sodium: 155 mmol/L — ABNORMAL HIGH (ref 135–145)
Sodium: 157 mmol/L — ABNORMAL HIGH (ref 135–145)

## 2014-04-24 NOTE — Progress Notes (Signed)
PT Cancellation Note and Discharge  Patient Details Name: Clinton [name] MRN: [id number] DOB: [birth year]   Cancelled Treatment:    Reason Eval/Treat Not Completed: Patient not medically ready: Pt with poor prognosis, not appropriate for therapy at this time.  PT signing off, please re-consult if needed in the future.    Laketra Bowdish 04/24/2014, 11:18 AM  Vella RaringKailee Madine Sarr, SPT (student physical therapist) Office phone: [phone]

## 2014-04-24 NOTE — Progress Notes (Addendum)
UR completed.  Received CM consult for long term acute care. Given note indicating "no chances of survival" and patient's insurance requiring 21 days before qualifying for LTACH, [facility].   Carlyle LipaMichelle Brianah Hopson, RN BSN MHA CCM Trauma/Neuro ICU Case Manager [phone]

## 2014-04-24 NOTE — Progress Notes (Signed)
Speech Language Pathology Discharge Patient Details Name: Clinton HamburgerMichael Septer MRN: [id number] DOB: 08/19/1950 Today's Date: 04/24/2014 Time:  -     Patient discharged from SLP services secondary to pt prognosis poor at present time..  Please see latest therapy progress note for current level of functioning and progress toward goals.    Progress and discharge plan discussed with patient and/or caregiver: Patient unable to participate in discharge planning and no caregivers available  GO     Royce MacadamiaLitaker, Corrine Tillis Willis 04/24/2014, 9:25 AM   Breck CoonsLisa Willis Lonell FaceLitaker M.Ed [employer]SLP Pager [phone]

## 2014-04-24 NOTE — Progress Notes (Signed)
OT Cancellation Note  Patient Details Name: Clinton [name] MRN: [id number] DOB: [birth year]   Cancelled Treatment:    Reason Eval/Treat Not Completed: Patient not medically ready Bedrest orders. Will check on pt tomorrow. [facility] HospitalWARD,[name]  Phoebie Shad, OTR/L  [phone] 04/24/2014 04/24/2014, 7:05 AM

## 2014-04-24 NOTE — Progress Notes (Signed)
Patient ID: Clinton [name], male   DOB: 07/29/1950, 64 y.o.   MRN: [id number] Subjective:  The patient is comatose and in no apparent distress. His wife and daughter are at the bedside.  Objective: Vital signs in last 24 hours: Temp:  [93.4 F (34.1 C)-99.1 F (37.3 C)] 98.8 F (37.1 C) (03/08 0700) Pulse Rate:  [55-74] 60 (03/08 0700) Resp:  [14-21] 18 (03/08 0700) BP: (93-200)/(49-84) 128/58 mmHg (03/08 0700) SpO2:  [92 %-100 %] 100 % (03/08 0700) Arterial Line BP: (156-189)/(51-68) 157/66 mmHg (03/08 0700) FiO2 (%):  [40 %] 40 % (03/08 0700) Weight:  [116.9 kg (257 lb 11.5 oz)-119.296 kg (263 lb)] 116.9 kg (257 lb 11.5 oz) (03/08 0412)  Intake/Output from previous day: 03/07 0701 - 03/08 0700 In: 2392.4 [I.V.:1877.9; NG/GT:214.5; IV Piggyback:200] Out: 5040 [Urine:4840; Blood:200] Intake/Output this shift:    Physical exam Glasgow Coma Scale 4 intubated, E1M2V1. His pupils are approximately 5 mm and nonreactive bilaterally. He decerebrate postures.  I have reviewed the patient's head CT performed this morning. The patient has had a massive left cerebrovascular accident. Most of the blood is gone but there is worsening mass effect/midline shift. His brainstem is hypodense and there is evidence of Duret hemorrhages.  Lab Results:  Recent Labs  05-21-14 0822 2014/05/21 0840 04/24/14 0544  WBC 5.8  --  11.5*  HGB 14.2 15.3 14.4  HCT 40.6 45.0 41.0  PLT 252  --  310   BMET  Recent Labs  21-May-2014 0822 May 21, 2014 0840  04/24/14 0240 04/24/14 0544  NA 135 136  < > 145 146*  K 4.0 4.2  --   --  4.1  CL 103 101  --   --  119*  CO2 25  --   --   --  25  GLUCOSE 204* 205*  --   --  260*  BUN 11 13  --   --  14  CREATININE 0.83 0.70  --   --  0.85  CALCIUM 8.6  --   --   --  8.7  < > = values in this interval not displayed.  Studies/Results: Ct Head Wo Contrast  04/24/2014   CLINICAL DATA:  Hemorrhagic stroke, RIGHT hemi paresis, status post LEFT craniotomy for evacuation.   EXAM: CT HEAD WITHOUT CONTRAST  TECHNIQUE: Contiguous axial images were obtained from the base of the skull through the vertex without intravenous contrast.  COMPARISON:  CT of the head 05/21/14  FINDINGS: Interval LEFT parotid occipital craniotomy for evacuation of LEFT cerebrum hematoma, small amount of residual blood products and pneumocephalus. Patchy areas of new blood products in the LEFT temporal, parietal, occipital lobes, with small amount of blood products and severe edema extending into the thalamus. 18 mm of LEFT-to-RIGHT midline shift, small amount of blood products layering in RIGHT occipital horn with RIGHT lateral ventricle entrapment, surrounding low-density interstitial edema. Confluent low-density throughout the LEFT cerebrum. Dense apparent LEFT carotid terminus.  Severe LEFT uncal herniation. Basal cistern effacement. New expansile edema within the pons with patchy apparent hemorrhage.  Ocular globes and orbital contents are nonsuspicious. Visualized paranasal sinuses and mastoid air cells are well aerated.  LEFT parotid occipital scalp hematoma with overlying skin staples.  IMPRESSION: Interval LEFT parietal occipital craniotomy for evacuation of known large intraparenchymal hematoma. New extensive vasogenic and possibly cytotoxic edema throughout the LEFT cerebrum, with patchy areas of new blood products including LEFT thalamus and pons. Dense apparent LEFT carotid terminus, this could reflect thrombosis and  ischemia.  18 mm LEFT to RIGHT midline shift, RIGHT ventricular entrapment with intraventricular blood products.  Dr. Leroy Kennedyamilo paged on April 24, 2014 at 0535 hours, waiting return call.   Electronically Signed   By: Awilda Metroourtnay  Bloomer   On: 04/24/2014 05:39   Ct Head Wo Contrast  04/24/2014   CLINICAL DATA:  Right-sided weakness, slurred speech.  EXAM: CT HEAD WITHOUT CONTRAST  TECHNIQUE: Contiguous axial images were obtained from the base of the skull through the vertex without  intravenous contrast.  COMPARISON:  None.  FINDINGS: There is a large left posterior temporal and occipital hemorrhage measuring 8.3 x 2.6 cm. Surrounding edema. Mass effect on the adjacent lateral ventricle. There is midline shift with transtentorial herniation of approximately 11 mm from left-to-right. No hydrocephalus. No intraventricular blood visualized at this time.  No acute calvarial abnormality. Visualized paranasal sinuses and mastoids clear. Orbital soft tissues unremarkable.  IMPRESSION: Large left temporal and occipital intra cerebral hemorrhage measuring up to 8.3 cm with 11 mm of left-to-right midline shift.  Critical Value/emergent results were called by telephone at the time of interpretation on 05/01/2014 at 8:39 am to Dr. Cyril Mourningamillo, who verbally acknowledged these results.   Electronically Signed   By: Charlett NoseKevin  Dover M.D.   On: 07/18/2014 08:39   Dg Chest Port 1 View  05/02/2014   CLINICAL DATA:  Post central line placement.  EXAM: PORTABLE CHEST - 1 VIEW  COMPARISON:  07/18/2014  FINDINGS: Left central line tip is in the upper SVC at the confluence of the innominate veins. Endotracheal tube is unchanged. No pneumothorax. Heart is upper limits normal in size. Mild perihilar and bibasilar opacities are similar to prior study. No effusions.  IMPRESSION: Left central line tip in the upper SVC. No pneumothorax. Endotracheal tube in stable position.  Stable mild perihilar and bibasilar opacities.   Electronically Signed   By: Charlett NoseKevin  Dover M.D.   On: 07/18/2014 10:49   Dg Chest Port 1 View  04/17/2014   CLINICAL DATA:  Status post intubation, NG tube insertion  EXAM: PORTABLE CHEST - 1 VIEW  COMPARISON:  None.  FINDINGS: Endotracheal to 7.4 cm from carinal. NG tube extends below the margin of the film and in the course of the esophagus. No pneumothorax. No pulmonary edema.  IMPRESSION: Support apparatus in good position.   Electronically Signed   By: Genevive BiStewart  Edmunds M.D.   On: 07/18/2014 09:15     Assessment/Plan: Left intracerebral hemorrhage, cerebrovascular accident: I have discussed the situation with the patient's wife and daughter. Unfortunately he has not improved with surgery. He has evidence of brainstem injury. The prognosis for meaningful recovery is dismal. I have recommended that they gather the family members and consider withdrawing support. I have answered all their questions. They understand.  LOS: 1 day     Temisha Murley D 04/24/2014, 7:38 AM
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
## 2014-04-24 NOTE — Progress Notes (Signed)
PULMONARY / CRITICAL CARE MEDICINE   Name: Clinton [name] MRN: [id number] DOB: 08/17/1950    ADMISSION DATE:  2014/05/23  CONSULTATION DATE:  23-May-2014  REFERRING MD :  Cyril Mourning (Stroke)  CHIEF COMPLAINT:  ICH, vent management   INITIAL PRESENTATION:  64yo male with hx DM and ?asthma admitted 3/7 with acute large left ICH with midline shift.  Intubated in ER and PCCM consulted for vent management.   STUDIES:  CT head 3/7>>> large L temporal and occipital ICH 8.3 cm with 11mm of L->R shift   SIGNIFICANT EVENTS: Crani 3/7  SUBJECTIVE: completely unresponsive,  CT with significant edema  VITAL SIGNS: Temp:  [93.4 F (34.1 C)-98.8 F (37.1 C)] 98.8 F (37.1 C) (03/08 0700) Pulse Rate:  [55-73] 59 (03/08 0739) Resp:  [14-18] 18 (03/08 0739) BP: (93-189)/(49-68) 146/68 mmHg (03/08 0739) SpO2:  [98 %-100 %] 100 % (03/08 0841) Arterial Line BP: (156-189)/(51-68) 157/66 mmHg (03/08 0700) FiO2 (%):  [40 %] 40 % (03/08 0841) Weight:  [116.9 kg (257 lb 11.5 oz)] 116.9 kg (257 lb 11.5 oz) (03/08 0412) HEMODYNAMICS:   VENTILATOR SETTINGS: Vent Mode:  [-] PRVC FiO2 (%):  [40 %] 40 % Set Rate:  [14 bmp-18 bmp] 18 bmp Vt Set:  [680 mL] 680 mL PEEP:  [5 cmH20] 5 cmH20 Plateau Pressure:  [15 cmH20-18 cmH20] 17 cmH20 INTAKE / OUTPUT:  Intake/Output Summary (Last 24 hours) at 04/24/14 1021 Last data filed at 04/24/14 0800  Gross per 24 hour  Intake 2467.42 ml  Output   5270 ml  Net -2802.58 ml    PHYSICAL EXAMINATION: General:  wdwn male, unresponsive Neuro: Off sedation unresponsive, does not withdraw to pain. HEENT:  Mm moist, no JVD, ETT  Cardiovascular:  s1s2 rrr Lungs:  resps even, non labored, mildly tachypneic on vent, very diminished throughout Abdomen:  Soft, +bs  Musculoskeletal:  Warm and dry, no edema   LABS:  CBC  Recent Labs Lab 2014-05-23 0822 05/23/14 0840 04/24/14 0544  WBC 5.8  --  11.5*  HGB 14.2 15.3 14.4  HCT 40.6 45.0 41.0  PLT 252  --  310    Coag's  Recent Labs Lab 2014-05-23 0822  APTT 27  INR 1.01   BMET  Recent Labs Lab May 23, 2014 0822 23-May-2014 0840  04/24/14 0240 04/24/14 0544 04/24/14 0820  NA 135 136  < > 145 146* 149*  K 4.0 4.2  --   --  4.1  --   CL 103 101  --   --  119*  --   CO2 25  --   --   --  25  --   BUN 11 13  --   --  14  --   CREATININE 0.83 0.70  --   --  0.85  --   GLUCOSE 204* 205*  --   --  260*  --   < > = values in this interval not displayed. Electrolytes  Recent Labs Lab May 23, 2014 0822 04/24/14 0544  CALCIUM 8.6 8.7   Sepsis Markers No results for input(s): LATICACIDVEN, PROCALCITON, O2SATVEN in the last 168 hours. ABG  Recent Labs Lab 2014-05-23 1050 04/24/14 0330  PHART 7.371 7.399  PCO2ART 46.0* 37.0  PO2ART 142.0* 137.0*   Liver Enzymes  Recent Labs Lab 2014-05-23 0822  AST 26  ALT 18  ALKPHOS 56  BILITOT 0.8  ALBUMIN 3.5   Cardiac Enzymes No results for input(s): TROPONINI, PROBNP in the last 168 hours. Glucose  Recent Labs Lab 2014/05/23  1149 05/11/2014 1539 04/29/2014 1956 05/06/2014 2314 04/24/14 0348 04/24/14 0806  GLUCAP 243* 219* 240* 261* 259* 233*    Imaging Ct Head Wo Contrast  05/09/2014   CLINICAL DATA:  Right-sided weakness, slurred speech.  EXAM: CT HEAD WITHOUT CONTRAST  TECHNIQUE: Contiguous axial images were obtained from the base of the skull through the vertex without intravenous contrast.  COMPARISON:  None.  FINDINGS: There is a large left posterior temporal and occipital hemorrhage measuring 8.3 x 2.6 cm. Surrounding edema. Mass effect on the adjacent lateral ventricle. There is midline shift with transtentorial herniation of approximately 11 mm from left-to-right. No hydrocephalus. No intraventricular blood visualized at this time.  No acute calvarial abnormality. Visualized paranasal sinuses and mastoids clear. Orbital soft tissues unremarkable.  IMPRESSION: Large left temporal and occipital intra cerebral hemorrhage measuring up to 8.3 cm  with 11 mm of left-to-right midline shift.  Critical Value/emergent results were called by telephone at the time of interpretation on 05/02/2014 at 8:39 am to Dr. Cyril Mourningamillo, who verbally acknowledged these results.   Electronically Signed   By: Charlett NoseKevin  Dover M.D.   On: 05/11/2014 08:39   Dg Chest Port 1 View  05/13/2014   CLINICAL DATA:  Post central line placement.  EXAM: PORTABLE CHEST - 1 VIEW  COMPARISON:  05/06/2014  FINDINGS: Left central line tip is in the upper SVC at the confluence of the innominate veins. Endotracheal tube is unchanged. No pneumothorax. Heart is upper limits normal in size. Mild perihilar and bibasilar opacities are similar to prior study. No effusions.  IMPRESSION: Left central line tip in the upper SVC. No pneumothorax. Endotracheal tube in stable position.  Stable mild perihilar and bibasilar opacities.   Electronically Signed   By: Charlett NoseKevin  Dover M.D.   On: 04/22/2014 10:49   Dg Chest Port 1 View  04/21/2014   CLINICAL DATA:  Status post intubation, NG tube insertion  EXAM: PORTABLE CHEST - 1 VIEW  COMPARISON:  None.  FINDINGS: Endotracheal to 7.4 cm from carinal. NG tube extends below the margin of the film and in the course of the esophagus. No pneumothorax. No pulmonary edema.  IMPRESSION: Support apparatus in good position.   Electronically Signed   By: Genevive BiStewart  Edmunds M.D.   On: 04/22/2014 09:15   ASSESSMENT / PLAN:  NEUROLOGIC Large L ICH with midline shift, s/p crani, devastating edema, not recoverable. P:   Spoke with family, no chances at recovery, DNR status.  Withdrawal in AM once brother arrives.  Will transition to comfort care then but no escalation of care in the meantime.  PULMONARY OETT 3/7>>> Acute respiratory failure - in setting large ICH ?hx asthma  P:   Full vent support, no wean.  CARDIOVASCULAR CVL L IJ CVL 3/7>>> HTN  P:  3%. No further escalation of care at this point.  RENAL No active issue  P:   Monitor chem with 3% NaCl    GASTROINTESTINAL No active issue  P:   PPI No TF.  HEMATOLOGIC ICH P:  Trend CBC SCD's   INFECTIOUS No active issue  P:   Monitor fever, wbc curve off abx   ENDOCRINE DM  P:   SSI    FAMILY  - Updates:  See discussion above.  DNR.  Withdrawal in AM.  No escalation of care.  The patient is critically ill with multiple organ systems failure and requires high complexity decision making for assessment and support, frequent evaluation and titration of therapies, application of advanced monitoring  technologies and extensive interpretation of multiple databases.   Critical Care Time devoted to patient care services described in this note is  35  Minutes. This time reflects time of care of this signee Dr Jennet Maduro. This critical care time does not reflect procedure time, or teaching time or supervisory time of PA/NP/Med student/Med Resident etc but could involve care discussion time.  Rush Farmer, M.D. [facility] Pulmonary/Critical Care Medicine. Pager: [phone]. After hours pager: [phone].

## 2014-04-25 LAB — GLUCOSE, CAPILLARY
Glucose-Capillary: 227 mg/dL — ABNORMAL HIGH (ref 70–99)
Glucose-Capillary: 253 mg/dL — ABNORMAL HIGH (ref 70–99)
Glucose-Capillary: 258 mg/dL — ABNORMAL HIGH (ref 70–99)

## 2014-04-25 LAB — SODIUM
Sodium: 160 mmol/L — ABNORMAL HIGH (ref 135–145)
Sodium: 163 mmol/L (ref 135–145)

## 2014-04-25 MED ORDER — MORPHINE SULFATE 25 MG/ML IV SOLN
10.0000 mg/h | INTRAVENOUS | Status: DC
  Administered 2014-04-25: 10 mg/h via INTRAVENOUS
  Filled 2014-04-25: qty 10

## 2014-04-25 MED ORDER — MORPHINE BOLUS VIA INFUSION
5.0000 mg | INTRAVENOUS | Status: DC | PRN
  Filled 2014-04-25: qty 20

## 2014-04-27 ENCOUNTER — Telehealth: Payer: Self-pay

## 2014-04-27 NOTE — Telephone Encounter (Addendum)
[facility] for Dr. Molli KnockYacoub 04/27/14.  Paging Dr. Molli KnockYacoub to see where he wants it sent.  [facility] 04/27/14.  Received back and faxed 04/27/14.  Awaiting original.  Received original in mail 04/30/14.  [facility] signature.  Received back 05/01/14.  [facility].

## 2014-05-10 NOTE — Discharge Summary (Signed)
NAMWilliam Hamburger:  [place], [place]               ACCOUNT NO.:  [id number]  MEDICAL RECORD NO.:  [id number]  LOCATION:  3M10C                        FACILITY:  [facility]  PHYSICIAN:  Felipa EvenerWesam Jake Rowan Pollman, MD  DATE OF BIRTH:  02-15-1951  DATE OF ADMISSION:  Jan 29, 2015 DATE OF DISCHARGE:  05/12/2014                              DISCHARGE SUMMARY   DEATH SUMMARY  The patient is a 64 year old male with a past medical history of diabetes and asthma, who presents to the hospital after being found unresponsive and found to have a large intracranial hemorrhage with a midline shift.  The  patient was intubated and admitted to the intensive care unit two days later after he continued to become  progressively unresponsive.  Neurosurgery was called and the patient underwent a craniotomy due to significant deterioration in symptoms; however after failing to improve, Neurology Service spoke with the patient's family and after discussion, a decision was made that the patient would not want that level care at which point the patient was transitioned to comfort care.  Morphine was restarted and the ET tube was removed.  The patient expired shortly thereafter with the family at bedside.     Felipa EvenerWesam Jake Keliah Harned, MD     WJY/MEDQ  D:  05/09/2014  T:  05/10/2014  Job:  [id number]

## 2014-05-18 NOTE — Progress Notes (Signed)
PULMONARY / CRITICAL CARE MEDICINE   Name: Clinton [name] MRN: [id number] DOB: [birth year]    ADMISSION DATE:  04/30/2014  CONSULTATION DATE:  04/27/2014  REFERRING MD :  Cyril Mourningamillo (Stroke)  CHIEF COMPLAINT:  ICH, vent management   INITIAL PRESENTATION:  64yo male with hx DM and ?asthma admitted 3/7 with acute large left ICH with midline shift.  Intubated in ER and PCCM consulted for vent management.   STUDIES:  CT head 3/7>>> large L temporal and occipital ICH 8.3 cm with 11mm of L->R shift   SIGNIFICANT EVENTS: Crani 3/7  SUBJECTIVE: completely unresponsive,  CT with significant edema  VITAL SIGNS: Temp:  [99.7 F (37.6 C)-102.8 F (39.3 C)] 102.8 F (39.3 C) (03/09 0800) Pulse Rate:  [60-117] 117 (03/09 0900) Resp:  [10-22] 14 (03/09 0900) BP: (126-196)/(54-75) 126/68 mmHg (03/09 0900) SpO2:  [99 %-100 %] 100 % (03/09 0900) Arterial Line BP: (111-214)/(53-112) 111/53 mmHg (03/09 0900) FiO2 (%):  [40 %] 40 % (03/09 0834) Weight:  [114.1 kg (251 lb 8.7 oz)] 114.1 kg (251 lb 8.7 oz) (03/09 0500) HEMODYNAMICS:   VENTILATOR SETTINGS: Vent Mode:  [-] PRVC FiO2 (%):  [40 %] 40 % Set Rate:  [18 bmp] 18 bmp Vt Set:  [680 mL] 680 mL PEEP:  [5 cmH20] 5 cmH20 Plateau Pressure:  [12 cmH20-19 cmH20] 15 cmH20 INTAKE / OUTPUT:  Intake/Output Summary (Last 24 hours) at 05/16/2014 0956 Last data filed at 04/29/2014 0900  Gross per 24 hour  Intake 1982.5 ml  Output   7425 ml  Net -5442.5 ml    PHYSICAL EXAMINATION: General:  wdwn male, unresponsive Neuro: Off sedation unresponsive, does not withdraw to pain. HEENT:  Mm moist, no JVD, ETT  Cardiovascular:  s1s2 rrr Lungs:  resps even, non labored, mildly tachypneic on vent, very diminished throughout Abdomen:  Soft, +bs  Musculoskeletal:  Warm and dry, no edema   LABS:  CBC  Recent Labs Lab 01/03/15 0822 01/03/15 0840 04/24/14 0544  WBC 5.8  --  11.5*  HGB 14.2 15.3 14.4  HCT 40.6 45.0 41.0  PLT 252  --  310    Coag's  Recent Labs Lab 01/03/15 0822  APTT 27  INR 1.01   BMET  Recent Labs Lab 01/03/15 0822 01/03/15 0840  04/24/14 0544  04/24/14 2213 04/22/2014 0314 05/10/2014 0800  NA 135 136  < > 146*  < > 157* 160* 163*  K 4.0 4.2  --  4.1  --   --   --   --   CL 103 101  --  119*  --   --   --   --   CO2 25  --   --  25  --   --   --   --   BUN 11 13  --  14  --   --   --   --   CREATININE 0.83 0.70  --  0.85  --   --   --   --   GLUCOSE 204* 205*  --  260*  --   --   --   --   < > = values in this interval not displayed. Electrolytes  Recent Labs Lab 01/03/15 0822 04/24/14 0544  CALCIUM 8.6 8.7   Sepsis Markers No results for input(s): LATICACIDVEN, PROCALCITON, O2SATVEN in the last 168 hours. ABG  Recent Labs Lab 01/03/15 1050 04/24/14 0330  PHART 7.371 7.399  PCO2ART 46.0* 37.0  PO2ART 142.0* 137.0*   Liver Enzymes  Recent Labs Lab 05/02/2014 0822  AST 26  ALT 18  ALKPHOS 56  BILITOT 0.8  ALBUMIN 3.5   Cardiac Enzymes No results for input(s): TROPONINI, PROBNP in the last 168 hours. Glucose  Recent Labs Lab 04/24/14 1159 04/24/14 1536 04/24/14 2022 May 21, 2014 0012 05-21-2014 0430 2014-05-21 0753  GLUCAP 221* 225* 261* 227* 253* 258*    Imaging Ct Head Wo Contrast  04/24/2014   CLINICAL DATA:  Hemorrhagic stroke, RIGHT hemi paresis, status post LEFT craniotomy for evacuation.  EXAM: CT HEAD WITHOUT CONTRAST  TECHNIQUE: Contiguous axial images were obtained from the base of the skull through the vertex without intravenous contrast.  COMPARISON:  CT of the head April 23, 2014  FINDINGS: Interval LEFT parotid occipital craniotomy for evacuation of LEFT cerebrum hematoma, small amount of residual blood products and pneumocephalus. Patchy areas of new blood products in the LEFT temporal, parietal, occipital lobes, with small amount of blood products and severe edema extending into the thalamus. 18 mm of LEFT-to-RIGHT midline shift, small amount of blood products  layering in RIGHT occipital horn with RIGHT lateral ventricle entrapment, surrounding low-density interstitial edema. Confluent low-density throughout the LEFT cerebrum. Dense apparent LEFT carotid terminus.  Severe LEFT uncal herniation. Basal cistern effacement. New expansile edema within the pons with patchy apparent hemorrhage.  Ocular globes and orbital contents are nonsuspicious. Visualized paranasal sinuses and mastoid air cells are well aerated.  LEFT parotid occipital scalp hematoma with overlying skin staples.  IMPRESSION: Interval LEFT parietal occipital craniotomy for evacuation of known large intraparenchymal hematoma. New extensive vasogenic and possibly cytotoxic edema throughout the LEFT cerebrum, with patchy areas of new blood products including LEFT thalamus and pons. Dense apparent LEFT carotid terminus, this could reflect thrombosis and ischemia.  18 mm LEFT to RIGHT midline shift, RIGHT ventricular entrapment with intraventricular blood products.  Dr. Leroy Kennedy paged on April 24, 2014 at 0535 hours, waiting return call.   Electronically Signed   By: Awilda Metro   On: 04/24/2014 05:39   Dg Chest Port 1 View  04/24/2014   CLINICAL DATA:  Respiratory failure.  EXAM: PORTABLE CHEST - 1 VIEW  COMPARISON:  05/05/2014.  FINDINGS: Endotracheal tube, left IJ line, NG tube in stable position. Mediastinal structures are stable. Stable calcified left hilar lymph node. Interim progression of bibasilar atelectasis and/or infiltrates. No pleural effusion or pneumothorax. No acute bony abnormality.  IMPRESSION: 1. Lines and tubes in stable position. 2. Interval progression of bibasilar atelectasis and/or infiltrates.   Electronically Signed   By: Maisie Fus  Register   On: 04/24/2014 07:46   ASSESSMENT / PLAN:  NEUROLOGIC Large L ICH with midline shift, s/p crani, devastating edema, not recoverable. P:   Morphine for comfort.  PULMONARY OETT 3/7>>> Acute respiratory failure - in setting large  ICH ?hx asthma  P:   Terminally extubate today.  CARDIOVASCULAR CVL L IJ CVL 3/7>>> HTN  P:  D/C 3% and d/c blood draws.  RENAL No active issue  P:   D/C blood draws.  GASTROINTESTINAL No active issue  P:   Comfort care.  HEMATOLOGIC ICH P:  D/C further blood draws.  INFECTIOUS No active issue  P:   Comfort care.  ENDOCRINE DM  P:   D/C CBGs.   FAMILY  - Updates:  Spoke with entire family, proceed with withdrawal of ventilator after starting morphine.  Full DNR.  The patient is critically ill with multiple organ systems failure and requires high complexity decision making for assessment and support, frequent  evaluation and titration of therapies, application of advanced monitoring technologies and extensive interpretation of multiple databases.   Critical Care Time devoted to patient care services described in this note is  35  Minutes. This time reflects time of care of this signee Dr Jennet Maduro. This critical care time does not reflect procedure time, or teaching time or supervisory time of PA/NP/Med student/Med Resident etc but could involve care discussion time.  Rush Farmer, M.D. [facility] Pulmonary/Critical Care Medicine. Pager: [phone]. After hours pager: [phone].

## 2014-05-18 NOTE — Progress Notes (Signed)
I visited with patient after receiving referral from out of town Animatorcolleague.  Colleague requested that I visit with  family because family was withdrawing life support today. I made family aware of their friend's request,love and prayers for them. I offered final blessings and  continued as needed.  Unit chaplain is following and supporting family.

## 2014-05-18 NOTE — Progress Notes (Signed)
Chaplain responded to spiritual care consult for end of life. Chaplain introduced herself to family members in room and facilitated storytelling. Chaplain will continue to follow.   2014-11-01 1100  Clinical Encounter Type  Visited With Patient and family together  Visit Type Initial;Spiritual support  Referral From Nurse;Chaplain  Spiritual Encounters  Spiritual Needs Emotional;Grief support  Stress Factors  Family Stress Factors Loss  Clinton [name], Clinton [name], Chaplain 05/13/2014 12:17 PM

## 2014-05-18 NOTE — Progress Notes (Signed)
STROKE TEAM PROGRESS NOTE   HISTORY Clinton HamburgerMichael Aydin is an 64 y.o. male with a past medical history significant for DM and asthma, brought in as a code stroke via EMS due to acute onset of confusion, dysarthria, right hemiparesis, right face weakness. As per EMS, patient was heading to work when a passerby saw him waving from inside his vehicle, in the driver seat. He was driving the wrong way. EMS was called and found him confused, with right sided weakness, slurred speech, a left gaze preference and BP 182/80. Upon arrival to the ED patient with NIHSS 24. CT brain was reviewed and revealed a large left temporal-occipital ICH measuring 8.3 cm with surrounding vasogenic edema and11 mm of left to right midline shift. No intraventricular extension or hydrocephalus. INR 1.1, PTT 27, platelets 252. Takes a baby aspirin daily but no report of recent fall, head trauma, or use or anticoagulants. Patient was not protecting his airway and required to be intubated. Started on nicardipine infusion. He was last known well 05/09/2014 at 7:15 am. Patient was not administered TPA secondary to ICH. He was admitted to the neuro ICU for further evaluation and treatment.   SUBJECTIVE (INTERVAL HISTORY) His  Daughters x 2 are at the bedside.  Overall he feels his condition is rapidly worsening.  Patient had neurological worsening  And today has lost pupillary and corneal reflexes  OBJECTIVE Temp:  [99.9 F (37.7 C)-102.8 F (39.3 C)] 102.8 F (39.3 C) (03/09 0800) Pulse Rate:  [61-117] 117 (03/09 0900) Cardiac Rhythm:  [-] Sinus tachycardia (03/09 0800) Resp:  [10-22] 14 (03/09 0900) BP: (126-196)/(54-75) 126/68 mmHg (03/09 0900) SpO2:  [99 %-100 %] 100 % (03/09 0900) Arterial Line BP: (111-214)/(53-112) 111/53 mmHg (03/09 0900) FiO2 (%):  [40 %] 40 % (03/09 0834) Weight:  [251 lb 8.7 oz (114.1 kg)] 251 lb 8.7 oz (114.1 kg) (03/09 0500)   Recent Labs Lab 04/24/14 1536 04/24/14 2022 18-Sep-2014 0012 18-Sep-2014 0430  18-Sep-2014 0753  GLUCAP 225* 261* 227* 253* 258*    Recent Labs Lab 05/13/2014 [id number] 05/09/2014 0840  04/24/14 0544 04/24/14 0820 04/24/14 1705 04/24/14 2213 18-Sep-2014 0314 18-Sep-2014 0800  NA 135 136  < > 146* 149* 155* 157* 160* 163*  K 4.0 4.2  --  4.1  --   --   --   --   --   CL 103 101  --  119*  --   --   --   --   --   CO2 25  --   --  25  --   --   --   --   --   GLUCOSE 204* 205*  --  260*  --   --   --   --   --   BUN 11 13  --  14  --   --   --   --   --   CREATININE 0.83 0.70  --  0.85  --   --   --   --   --   CALCIUM 8.6  --   --  8.7  --   --   --   --   --   < > = values in this interval not displayed.  Recent Labs Lab 04/26/2014 0822  AST 26  ALT 18  ALKPHOS 56  BILITOT 0.8  PROT 6.2  ALBUMIN 3.5    Recent Labs Lab 05/17/2014 0822 05/16/2014 0840 04/24/14 0544  WBC 5.8  --  11.5*  NEUTROABS 4.2  --   --   HGB 14.2 15.3 14.4  HCT 40.6 45.0 41.0  MCV 88.6  --  88.4  PLT 252  --  310   No results for input(s): CKTOTAL, CKMB, CKMBINDEX, TROPONINI in the last 168 hours.  Recent Labs  04/22/2014 0822  LABPROT 13.4  INR 1.01    Recent Labs  05/10/2014 0905  COLORURINE YELLOW  LABSPEC 1.015  PHURINE 6.0  GLUCOSEU 250*  HGBUR NEGATIVE  BILIRUBINUR NEGATIVE  KETONESUR NEGATIVE  PROTEINUR NEGATIVE  UROBILINOGEN 1.0  NITRITE NEGATIVE  LEUKOCYTESUR NEGATIVE    No results found for: CHOL, TRIG, HDL, CHOLHDL, VLDL, LDLCALC Lab Results  Component Value Date   HGBA1C 6.6* 04/29/2014      Component Value Date/Time   LABOPIA NONE DETECTED 05/17/2014 0905   COCAINSCRNUR NONE DETECTED 04/22/2014 0905   LABBENZ NONE DETECTED 04/28/2014 0905   AMPHETMU NONE DETECTED 05/17/2014 0905   THCU NONE DETECTED 05/14/2014 0905   LABBARB NONE DETECTED 04/21/2014 0905     Recent Labs Lab 05/07/2014 0822  ETH <5    Ct Head Wo Contrast  04/24/2014   CLINICAL DATA:  Hemorrhagic stroke, RIGHT hemi paresis, status post LEFT craniotomy for evacuation.  EXAM: CT HEAD  WITHOUT CONTRAST  TECHNIQUE: Contiguous axial images were obtained from the base of the skull through the vertex without intravenous contrast.  COMPARISON:  CT of the head April 23, 2014  FINDINGS: Interval LEFT parotid occipital craniotomy for evacuation of LEFT cerebrum hematoma, small amount of residual blood products and pneumocephalus. Patchy areas of new blood products in the LEFT temporal, parietal, occipital lobes, with small amount of blood products and severe edema extending into the thalamus. 18 mm of LEFT-to-RIGHT midline shift, small amount of blood products layering in RIGHT occipital horn with RIGHT lateral ventricle entrapment, surrounding low-density interstitial edema. Confluent low-density throughout the LEFT cerebrum. Dense apparent LEFT carotid terminus.  Severe LEFT uncal herniation. Basal cistern effacement. New expansile edema within the pons with patchy apparent hemorrhage.  Ocular globes and orbital contents are nonsuspicious. Visualized paranasal sinuses and mastoid air cells are well aerated.  LEFT parotid occipital scalp hematoma with overlying skin staples.  IMPRESSION: Interval LEFT parietal occipital craniotomy for evacuation of known large intraparenchymal hematoma. New extensive vasogenic and possibly cytotoxic edema throughout the LEFT cerebrum, with patchy areas of new blood products including LEFT thalamus and pons. Dense apparent LEFT carotid terminus, this could reflect thrombosis and ischemia.  18 mm LEFT to RIGHT midline shift, RIGHT ventricular entrapment with intraventricular blood products.  Dr. Leroy Kennedy paged on April 24, 2014 at 0535 hours, waiting return call.   Electronically Signed   By: Awilda Metro   On: 04/24/2014 05:39   Dg Chest Port 1 View  04/24/2014   CLINICAL DATA:  Respiratory failure.  EXAM: PORTABLE CHEST - 1 VIEW  COMPARISON:  05/08/2014.  FINDINGS: Endotracheal tube, left IJ line, NG tube in stable position. Mediastinal structures are stable. Stable  calcified left hilar lymph node. Interim progression of bibasilar atelectasis and/or infiltrates. No pleural effusion or pneumothorax. No acute bony abnormality.  IMPRESSION: 1. Lines and tubes in stable position. 2. Interval progression of bibasilar atelectasis and/or infiltrates.   Electronically Signed   By: Maisie Fus  Register   On: 04/24/2014 07:46   Dg Chest Port 1 View  05/01/2014   CLINICAL DATA:  Post central line placement.  EXAM: PORTABLE CHEST - 1 VIEW  COMPARISON:  05/02/2014  FINDINGS: Left central line  tip is in the upper SVC at the confluence of the innominate veins. Endotracheal tube is unchanged. No pneumothorax. Heart is upper limits normal in size. Mild perihilar and bibasilar opacities are similar to prior study. No effusions.  IMPRESSION: Left central line tip in the upper SVC. No pneumothorax. Endotracheal tube in stable position.  Stable mild perihilar and bibasilar opacities.   Electronically Signed   By: Charlett Nose M.D.   On: 04/20/2014 10:49     PHYSICAL EXAM  ICH score  4 on admission GCS    3-4    +2  4 in this patient  5-12  +1   13-15  0  Age ? 80   yes 0  no  +1  ICH Volume ? 30ml   74.25 ml  Yes 0   no  +1  Intraventricular Hemorrhage  Yes  Yes 0   no +1  Infratentorial Origin of Hemorrhage  No  Yes 0   No  +1  . Intubated not sedatedAfebrile. Head is nontraumatic. Neck is supple without bruit.    Cardiac exam no murmur or gallop. Lungs are clear to auscultation. Distal pulses are well felt. Neurological Exam :  Intubated. Not sedated. Comatose and unresponsive. Pupils 3 mm not reactive. Corneal reflexes are absent bilaterally. Fundi were not visualized. Eyes are in primary position. Face is symmetric. No cough and gag  With minimal posturing of the lower extremities to suctioning. Minimum spontaneous respiration above the ventilator setting.No  Extensor posturing of lower extremities to deep sternal rub. No response in the upper extremities to noxious  stimuli and nailbed pressure. Trace withdrawal in the lower extremities to painful stimuli. Both plantars are upgoing. ASSESSMENT/PLAN Clinton [name] is a 64 y.o. male with history of diabetes and asthma presenting with confusion, dysarthria, right hemiparesis, right face weakness. He did not receive IV t-PA due to ICH. 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 Now w/ extensive L cerebral edema with patchy areas of hemorrhage w/ 18mm L to R mideline shift, R ventricular entrament w/ IVH. Dense apparent LEFT carotid terminus, this could reflect thrombosis and ischemia.    SCDs for VTE prophylaxis  no antithrombotic prior to admission   Therapy recommendations:  pending   Disposition:  pending   Induced Hypernatremia  Started on 3% saline to decrease cerebral edema  Na 146  Acute Respiratory Failure  Secondary to ICH  Intubated in the ED  Accelerated Hypertension  BP 190/74 on arrival  Started on nicardipine  BP goal 140-160  Home meds:   lisinopril  Stable now  Hyperlipidemia, on omega 3 PTA, LDL not drawn  Diabetes  Home meds:  Glyburide, levemir, lisinopril, metformin  HgbA1c pending, goal < 7.0  Likely Uncontrolled based on glucoses  Dysphagia  Secondary to ICH  NPO  Started on tube feedings  High residuals led to treatment with reglan  Other Pertinent History  Worked as a Psychologist, occupational and known 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 I have made any additions or clarifications directly to the above note. Agree with note above. The patient has unfortunately had a massive parenchymal hematoma with intraventricular extension and hydrocephalus  And volume 74 cc and brain herniation and despite emergent craniotomy neurological exam as well as follow-up CT scan shows significant evidence of cerebral edema, hydrocephalus and brain herniation. Patient is extremely unlikely to survive and have any meaningful quality of life. I had a long discussion with the patient's daughters x 2  at the bedside and explained his prognosis, neurological exam, imaging findings and answered questions. She understands the grave situation and states patient would not have wanted to be kept alive with prolonged life support and living a life of disability requiring 24-hour care. They agree to DO NOT RESUSCITATE and do not escalate but would like to continue life support till arrival of patient's family from out of town later today. She will likely make him comfort care and terminal extubation today This patient is critically ill and at significant risk of neurological worsening, death and care requires constant monitoring of vital signs, hemodynamics,respiratory and cardiac monitoring,review of multiple databases, neurological assessment, discussion with family, other specialists and medical decision making of high complexity.I have made any additions or clarifications directly to the above note.  I spent 30 minutes of neurocritical care time  in the care of  this patient.   Delia Heady, MD Medical Director [facility] Pager: [phone] 05/10/2014 10:02 AM     To contact Stroke Continuity provider, please refer to [email or web address]. After hours, contact General Neurology

## 2014-05-18 NOTE — Clinical Social Work Note (Signed)
Clinical Social Worker received referral for comfort care support for family who is withdrawing support from patient.  CSW has spoken with RN and Chaplin regarding patient family.  Patient family has already built rapport/relationship with Chaplin who will continue to follow patient family through withdraw process.  RN to notify CSW if further needs arise following patient death.  CSW remains available for outside support to family and staff as needed.  Jesse Keyondre Hepburn, LCSW [phone] 

## 2014-05-18 NOTE — Progress Notes (Signed)
Subjective: Patient reports comatose  Objective: Vital signs in last 24 hours: Temp:  [99.7 F (37.6 C)-102.8 F (39.3 C)] 102.3 F (39.1 C) (03/09 0400) Pulse Rate:  [60-102] 102 (03/09 0800) Resp:  [10-22] 18 (03/09 0800) BP: (143-196)/(54-75) 165/75 mmHg (03/09 0838) SpO2:  [99 %-100 %] 100 % (03/09 0834) Arterial Line BP: (122-214)/(53-112) 199/81 mmHg (03/09 0800) FiO2 (%):  [40 %] 40 % (03/09 0834) Weight:  [114.1 kg (251 lb 8.7 oz)] 114.1 kg (251 lb 8.7 oz) (03/09 0500)  Intake/Output from previous day: 03/08 0701 - 03/09 0700 In: 2212.5 [I.V.:1172.5; NG/GT:840; IV Piggyback:200] Out: 7180 [Urine:7180] Intake/Output this shift:    Physical Exam: Continues to do poorly with minimal brain function  Lab Results:  Recent Labs  May 21, 2014 0822 May 21, 2014 0840 04/24/14 0544  WBC 5.8  --  11.5*  HGB 14.2 15.3 14.4  HCT 40.6 45.0 41.0  PLT 252  --  310   BMET  Recent Labs  05/21/2014 0822 2014/05/21 0840  04/24/14 0544  04/17/2014 0314 05/03/2014 0800  NA 135 136  < > 146*  < > 160* 163*  K 4.0 4.2  --  4.1  --   --   --   CL 103 101  --  119*  --   --   --   CO2 25  --   --  25  --   --   --   GLUCOSE 204* 205*  --  260*  --   --   --   BUN 11 13  --  14  --   --   --   CREATININE 0.83 0.70  --  0.85  --   --   --   CALCIUM 8.6  --   --  8.7  --   --   --   < > = values in this interval not displayed.  Studies/Results: Ct Head Wo Contrast  04/24/2014   CLINICAL DATA:  Hemorrhagic stroke, RIGHT hemi paresis, status post LEFT craniotomy for evacuation.  EXAM: CT HEAD WITHOUT CONTRAST  TECHNIQUE: Contiguous axial images were obtained from the base of the skull through the vertex without intravenous contrast.  COMPARISON:  CT of the head 2014-05-21  FINDINGS: Interval LEFT parotid occipital craniotomy for evacuation of LEFT cerebrum hematoma, small amount of residual blood products and pneumocephalus. Patchy areas of new blood products in the LEFT temporal, parietal,  occipital lobes, with small amount of blood products and severe edema extending into the thalamus. 18 mm of LEFT-to-RIGHT midline shift, small amount of blood products layering in RIGHT occipital horn with RIGHT lateral ventricle entrapment, surrounding low-density interstitial edema. Confluent low-density throughout the LEFT cerebrum. Dense apparent LEFT carotid terminus.  Severe LEFT uncal herniation. Basal cistern effacement. New expansile edema within the pons with patchy apparent hemorrhage.  Ocular globes and orbital contents are nonsuspicious. Visualized paranasal sinuses and mastoid air cells are well aerated.  LEFT parotid occipital scalp hematoma with overlying skin staples.  IMPRESSION: Interval LEFT parietal occipital craniotomy for evacuation of known large intraparenchymal hematoma. New extensive vasogenic and possibly cytotoxic edema throughout the LEFT cerebrum, with patchy areas of new blood products including LEFT thalamus and pons. Dense apparent LEFT carotid terminus, this could reflect thrombosis and ischemia.  18 mm LEFT to RIGHT midline shift, RIGHT ventricular entrapment with intraventricular blood products.  Dr. Leroy Kennedy paged on April 24, 2014 at 0535 hours, waiting return call.   Electronically Signed   By: Pernell Dupre  Bloomer   On: 04/24/2014 05:39   Dg Chest Port 1 View  04/24/2014   CLINICAL DATA:  Respiratory failure.  EXAM: PORTABLE CHEST - 1 VIEW  COMPARISON:  05-Oct-2014.  FINDINGS: Endotracheal tube, left IJ line, NG tube in stable position. Mediastinal structures are stable. Stable calcified left hilar lymph node. Interim progression of bibasilar atelectasis and/or infiltrates. No pleural effusion or pneumothorax. No acute bony abnormality.  IMPRESSION: 1. Lines and tubes in stable position. 2. Interval progression of bibasilar atelectasis and/or infiltrates.   Electronically Signed   By: Maisie Fushomas  Register   On: 04/24/2014 07:46   Dg Chest Port 1 View  04/20/2014   CLINICAL DATA:   Post central line placement.  EXAM: PORTABLE CHEST - 1 VIEW  COMPARISON:  05-Oct-2014  FINDINGS: Left central line tip is in the upper SVC at the confluence of the innominate veins. Endotracheal tube is unchanged. No pneumothorax. Heart is upper limits normal in size. Mild perihilar and bibasilar opacities are similar to prior study. No effusions.  IMPRESSION: Left central line tip in the upper SVC. No pneumothorax. Endotracheal tube in stable position.  Stable mild perihilar and bibasilar opacities.   Electronically Signed   By: Charlett NoseKevin  Dover M.D.   On: 05-Oct-2014 10:49    Assessment/Plan: Plan per Dr. Pearlean BrownieSethi is to change to comfort care after discussion with patient's wife.  Nothing to add from Neurosurgery perspective at this time.    LOS: 2 days    Dorian HeckleSTERN,Saleemah Mollenhauer D, MD 04/19/2014, 9:26 AM

## 2014-05-18 NOTE — Progress Notes (Signed)
Narcotic Waste:  Morphine drip 250 mg in 250 ml; 25 ml given, 225 ml wasted in sink.  Wittnessed by Thomes CakeMari Furr, RN.

## 2014-05-18 NOTE — Progress Notes (Signed)
RT Note- patient extubated per MD and family wishes with withdrawal order.

## 2014-05-18 NOTE — Progress Notes (Signed)
CRITICAL VALUE ALERT  Critical value received:  Sodium 163  Date of notification:  04/23/2014  Time of notification:  0858  Critical value read back: yes  Nurse who received alert:  Alyson LocketPaul Niki Cosman, RN  MD notified (1st page):  Molli KnockYacoub  Time of first page:  0900  MD notified (2nd page):  Time of second page:  Responding MD:  Molli KnockYacoub  Time MD responded:  [phone]

## 2014-05-18 NOTE — Progress Notes (Signed)
Pt with asystole per cardiac monitor, absent respirations, absent pulse, no heart sounds auscultated.  Pronounced dead 05/05/2014 at 13:43.  Dr. Molli KnockYacoub notified. Alyson LocketPaul Layman Gully, RN

## 2014-05-18 NOTE — Progress Notes (Signed)
OT Cancellation Note  Patient Details Name: Clinton [name] MRN: [id number] DOB: September 20, 1950   Cancelled Treatment:    Reason Eval/Treat Not Completed: Other (comment) (OT signing off)  [facility] HospitalWARD,HILLARY  Zareya Tuckett, OTR/L  [phone] 05/01/2014 04/24/2014, 6:53 AM

## 2014-05-18 DEATH — deceased

## 2016-10-27 IMAGING — CT CT HEAD W/O CM
1 series · 15 of 30 positions shown, 19 images · non-contrast
Comparison: CT of the head April 23, 2014

CLINICAL DATA: Hemorrhagic stroke, RIGHT hemi paresis, status post
LEFT craniotomy for evacuation.

EXAM:
CT HEAD WITHOUT CONTRAST
TECHNIQUE: Contiguous axial images were obtained from the base of the skull
through the vertex without intravenous contrast.

[Series 2: head 5.0 h30s · axial · 0.49mm/px · z∈[-139,-4]mm · 15 of 31 slices shown, 19 images]
[im 2/31  brain]
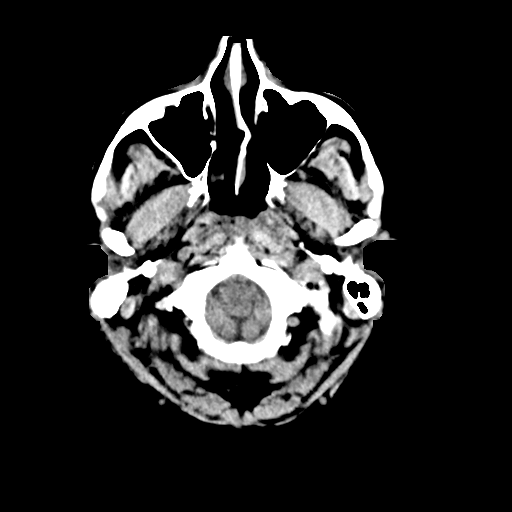
[im 2/31  bone]
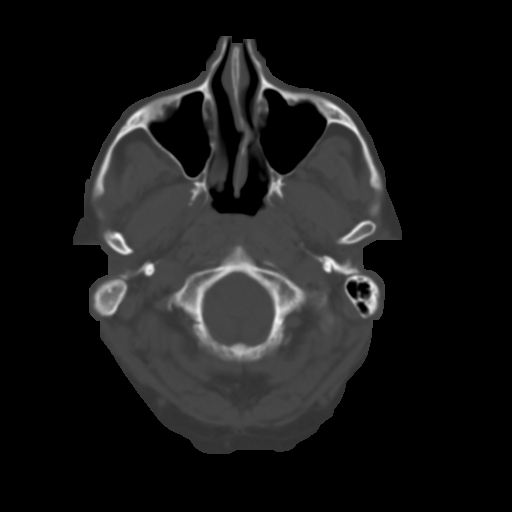
[im 4/31  brain]
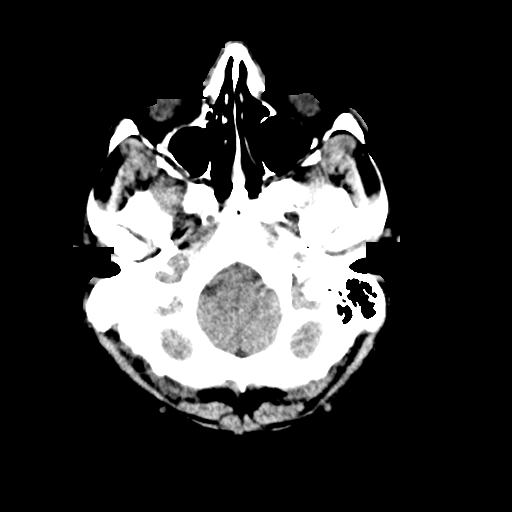
[im 6/31  brain]
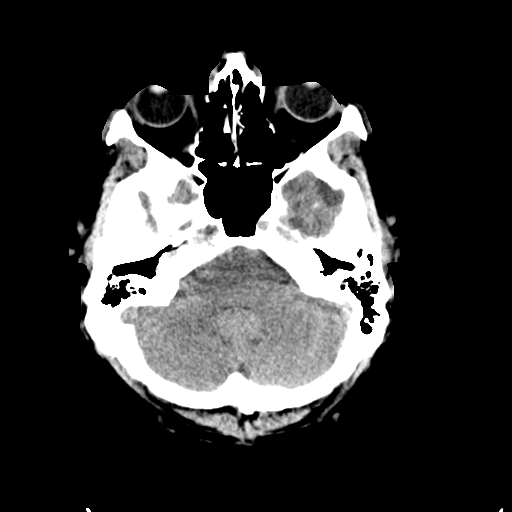
[im 8/31  brain]
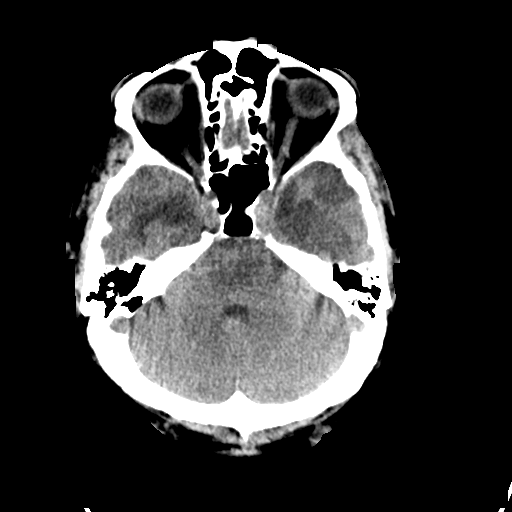
[im 10/31  brain]
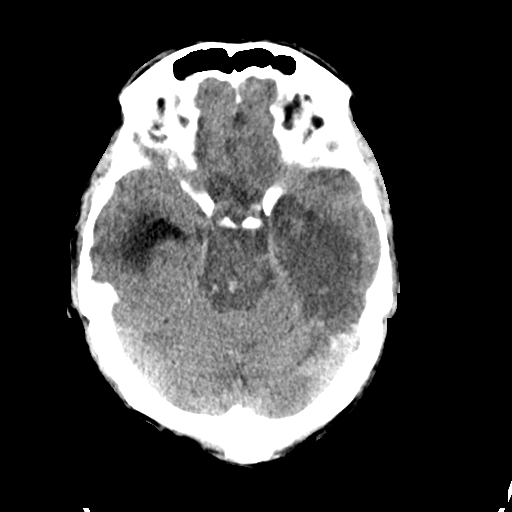
[im 10/31  bone]
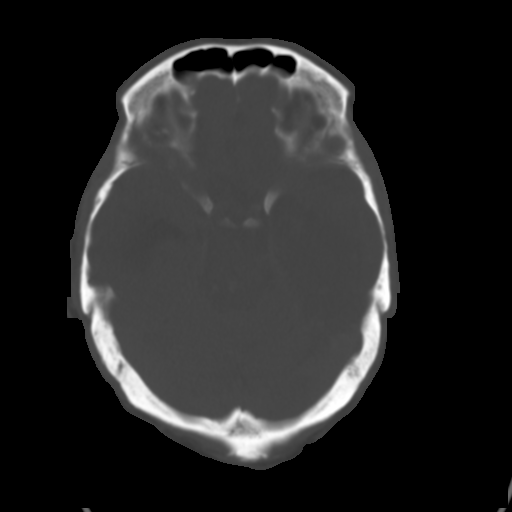
[im 12/31  brain]
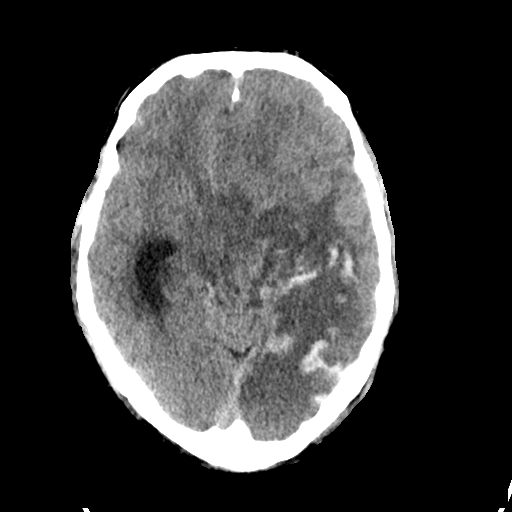
[im 14/31  brain]
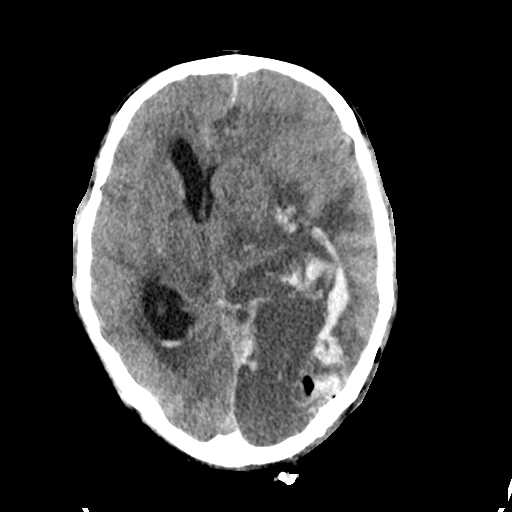
[im 16/31  brain]
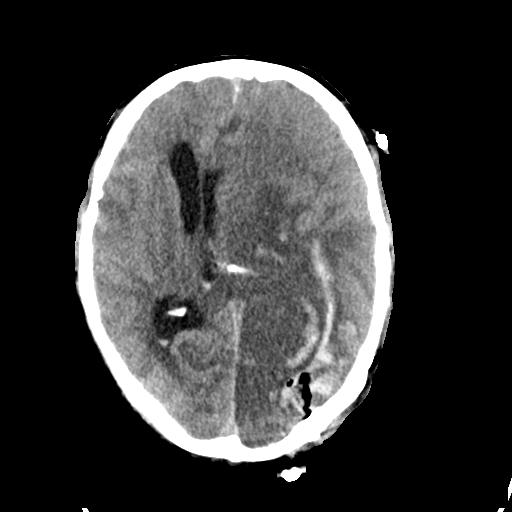
[im 17/31  brain]
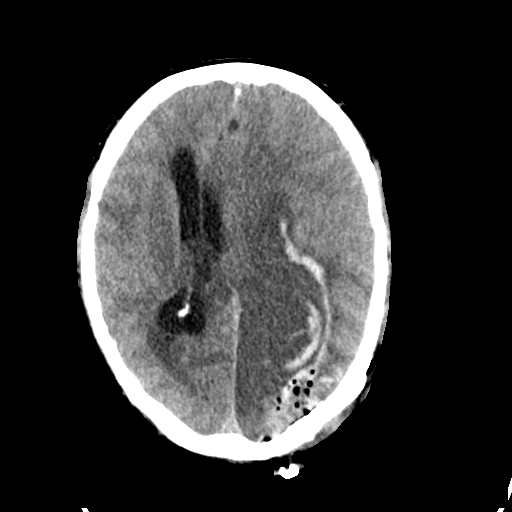
[im 17/31  bone]
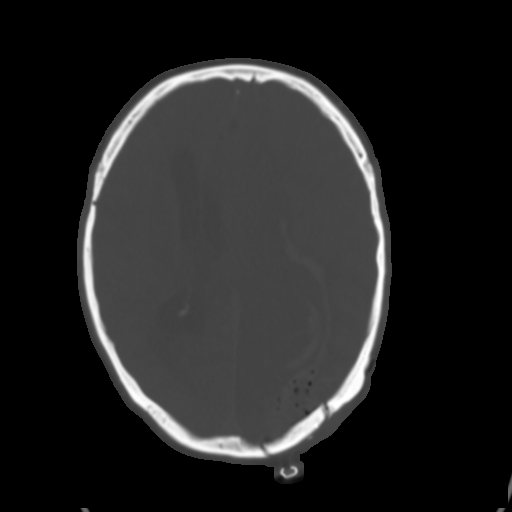
[im 19/31  brain]
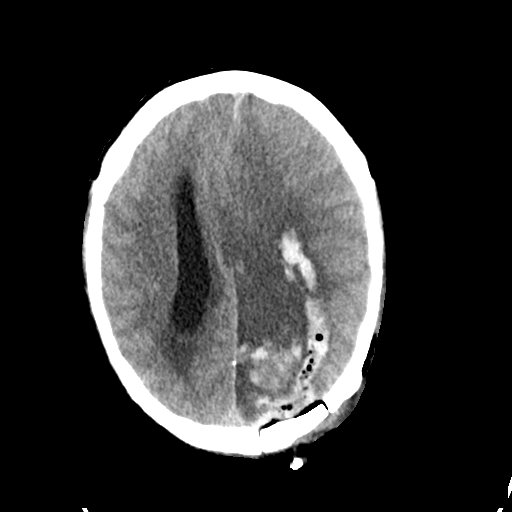
[im 21/31  brain]
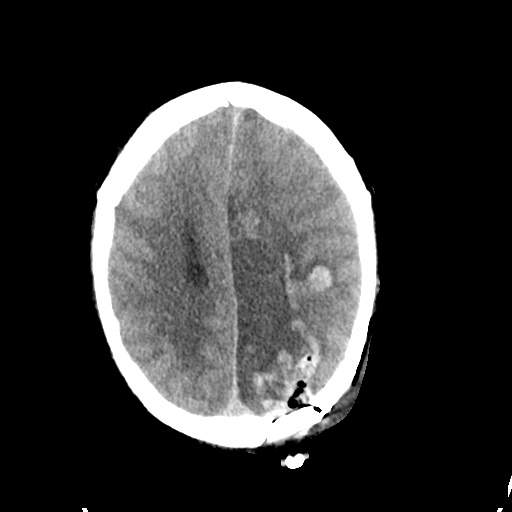
[im 23/31  brain]
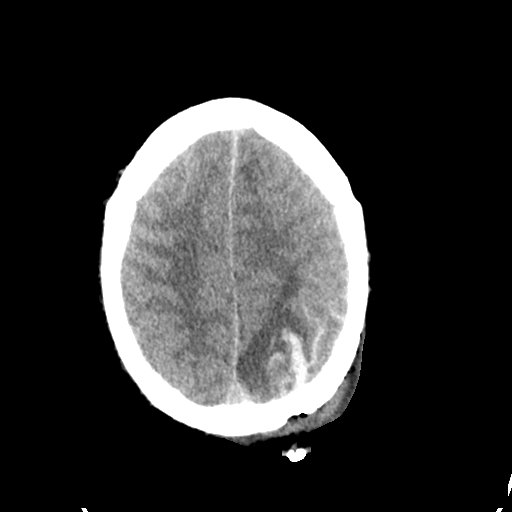
[im 25/31  brain]
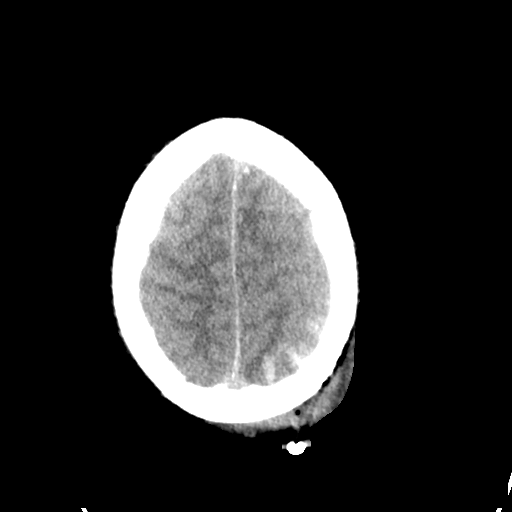
[im 25/31  bone]
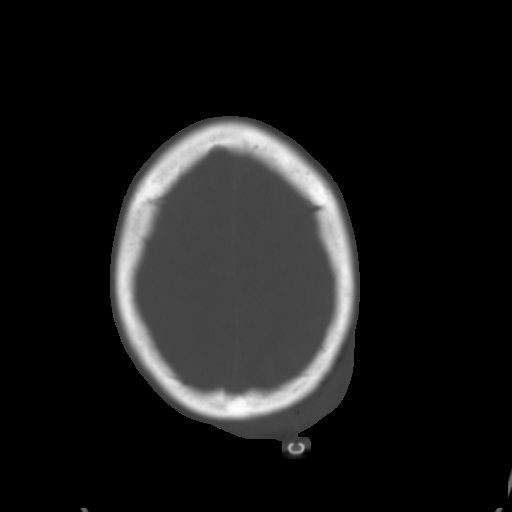
[im 27/31  brain]
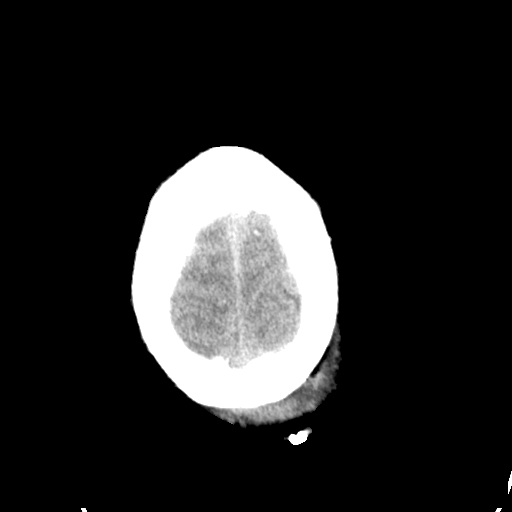
[im 29/31  brain]
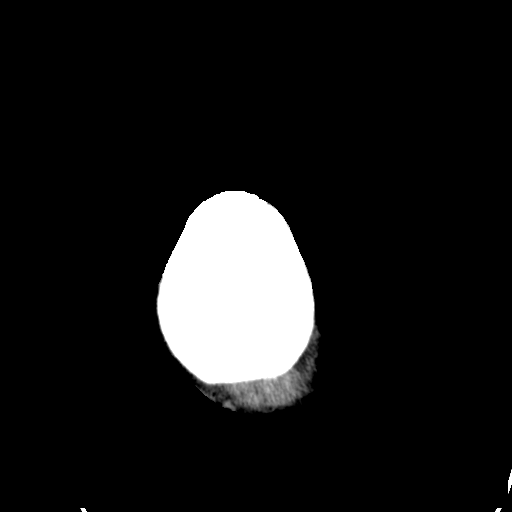

[15 of 30 positions shown; findings below may reference images not displayed]

FINDINGS: Interval LEFT parotid occipital craniotomy for evacuation of LEFT
cerebrum hematoma, small amount of residual blood products and
pneumocephalus. Patchy areas of new blood products in the LEFT
temporal, parietal, occipital lobes, with small amount of blood
products and severe edema extending into the thalamus. 18 mm of
LEFT-to-RIGHT midline shift, small amount of blood products layering
in RIGHT occipital horn with RIGHT lateral ventricle entrapment,
surrounding low-density interstitial edema. Confluent low-density
throughout the LEFT cerebrum. Dense apparent LEFT carotid terminus.

Severe LEFT uncal herniation. Basal cistern effacement. New
expansile edema within the pons with patchy apparent hemorrhage.

Ocular globes and orbital contents are nonsuspicious. Visualized
paranasal sinuses and mastoid air cells are well aerated.

LEFT parotid occipital scalp hematoma with overlying skin staples.
IMPRESSION: Interval LEFT parietal occipital craniotomy for evacuation of known
large intraparenchymal hematoma. New extensive vasogenic and
possibly cytotoxic edema throughout the LEFT cerebrum, with patchy
areas of new blood products including LEFT thalamus and pons. Dense
apparent LEFT carotid terminus, this could reflect thrombosis and
ischemia.

18 mm LEFT to RIGHT midline shift, RIGHT ventricular entrapment with
intraventricular blood products.

Dr. Conil paged on April 24, 2014 at 6414 hours, waiting return
call.

  By: Sorin Oxendine

## 2016-10-27 IMAGING — CR DG CHEST 1V PORT
2 series · 2 of 2 positions shown · non-contrast
Comparison: 04/23/2014.

CLINICAL DATA: Respiratory failure.

EXAM:
PORTABLE CHEST - 1 VIEW

[AP (1 of 2)]
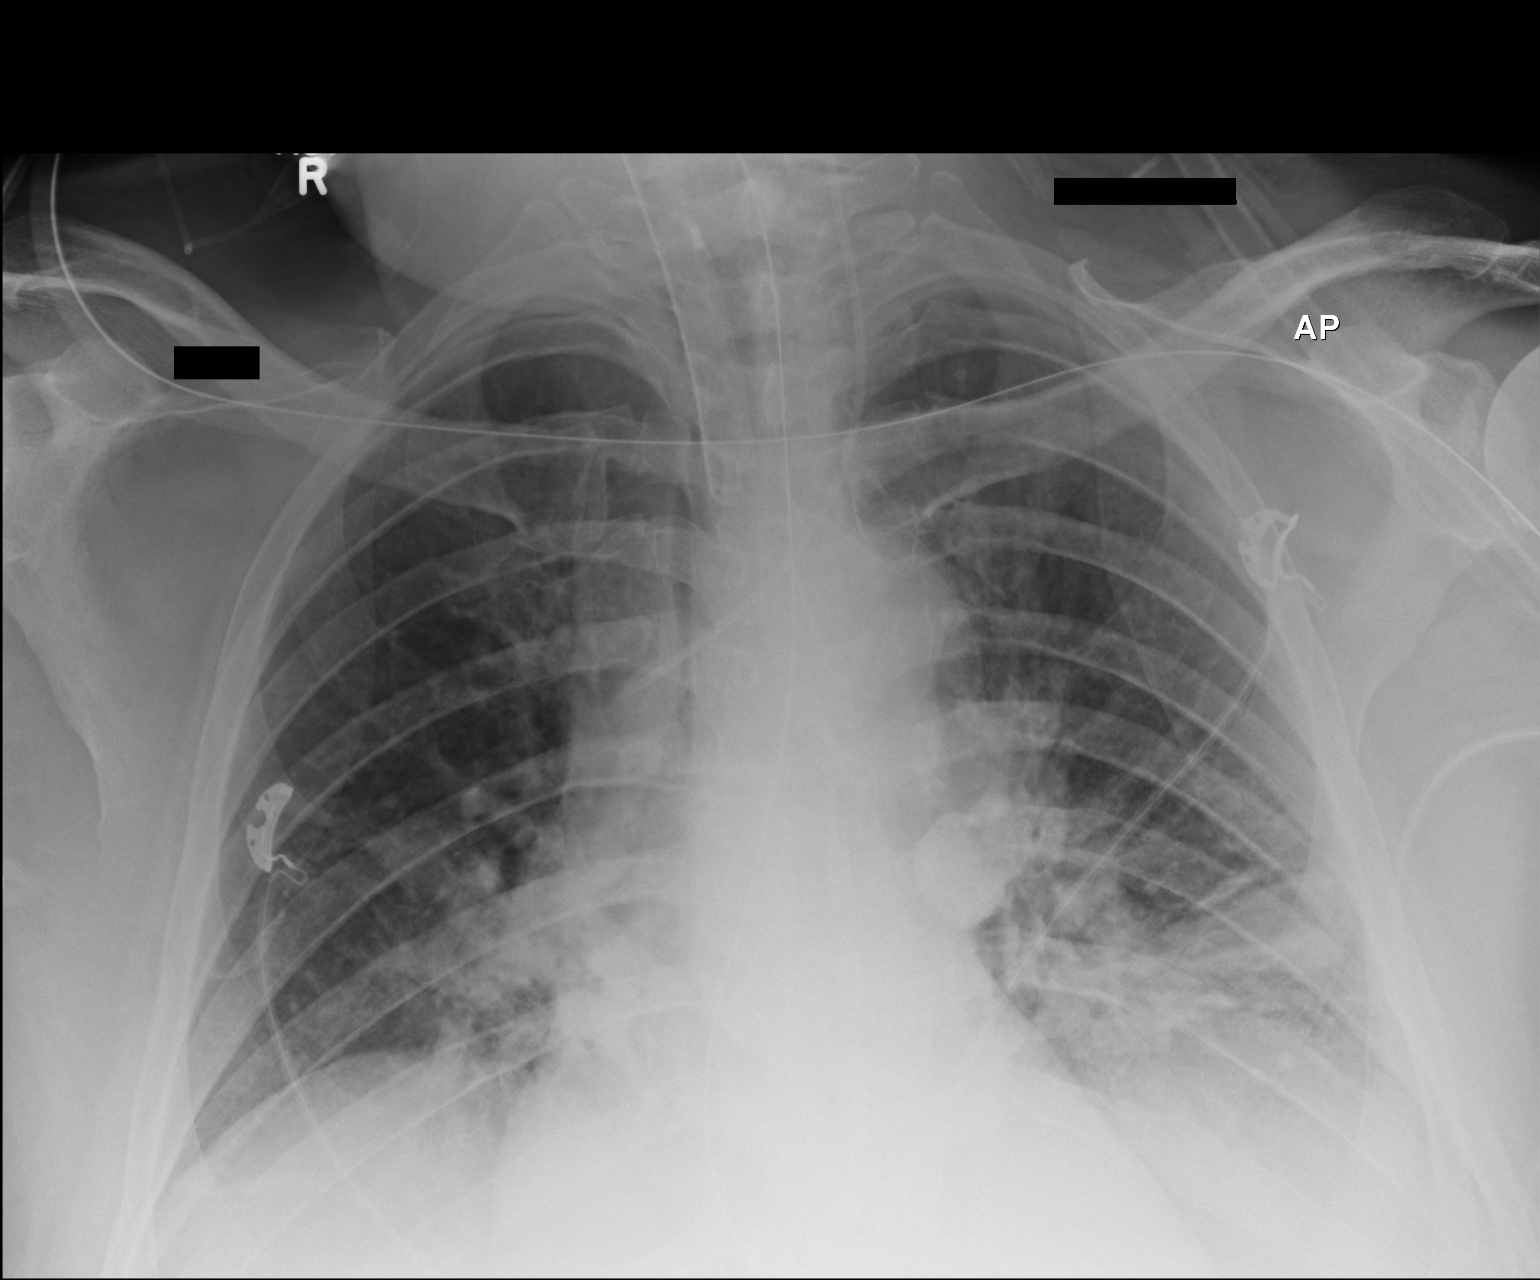

[AP (2 of 2)]
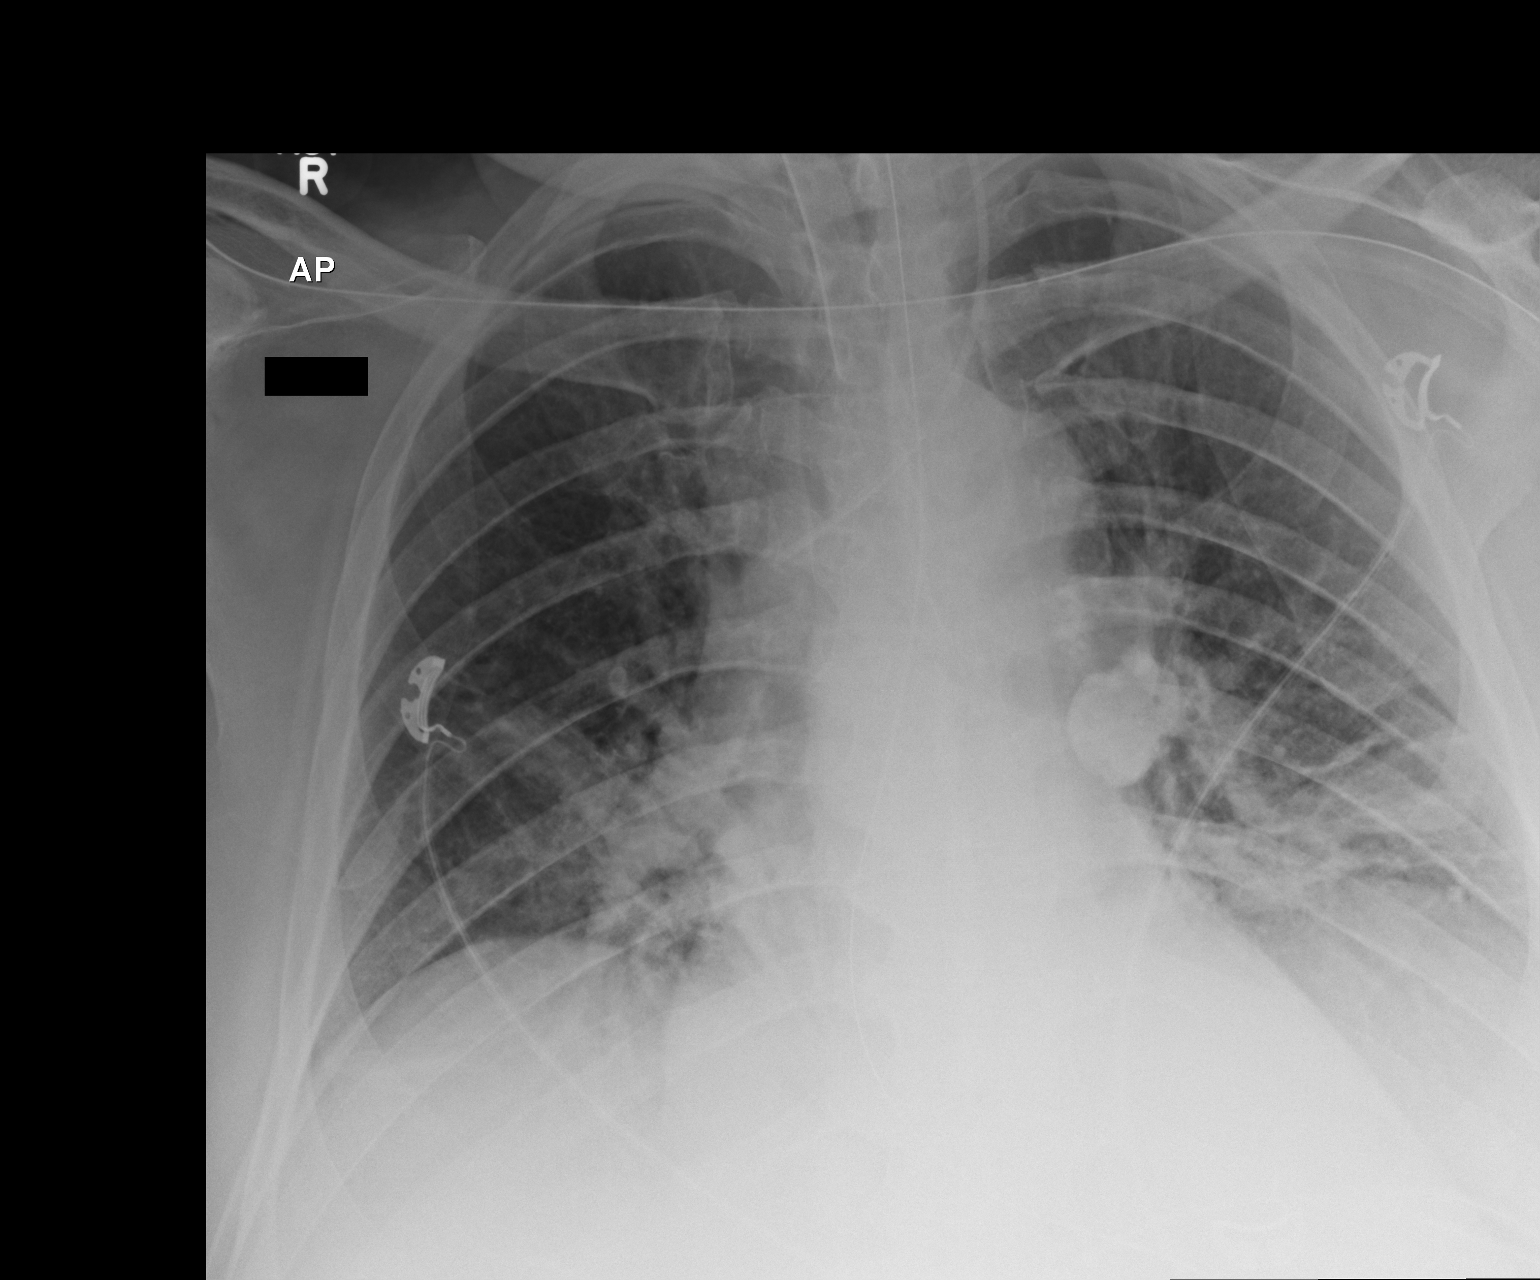

[2 of 2 positions shown; findings below may reference images not displayed]

FINDINGS: Endotracheal tube, left IJ line, NG tube in stable position.
Mediastinal structures are stable. Stable calcified left hilar lymph
node. Interim progression of bibasilar atelectasis and/or
infiltrates. No pleural effusion or pneumothorax. No acute bony
abnormality.
IMPRESSION: 1. Lines and tubes in stable position.
2. Interval progression of bibasilar atelectasis and/or infiltrates.
